# Patient Record
Sex: Female | Born: 1960 | Hispanic: Refuse to answer | Marital: Single
Health system: Southern US, Community
[De-identification: ages and names within clinical notes are randomized; demographics above are authoritative.]

---

## 2010-06-30 ENCOUNTER — Ambulatory Visit: Payer: Self-pay | Admitting: Internal Medicine

## 2010-07-08 ENCOUNTER — Inpatient Hospital Stay: Payer: Self-pay | Admitting: Internal Medicine

## 2010-07-30 ENCOUNTER — Ambulatory Visit: Payer: Self-pay | Admitting: Internal Medicine

## 2010-08-30 ENCOUNTER — Ambulatory Visit: Payer: Self-pay | Admitting: Internal Medicine

## 2010-12-21 ENCOUNTER — Ambulatory Visit: Payer: Self-pay | Admitting: Geriatric Medicine

## 2011-03-25 ENCOUNTER — Ambulatory Visit: Payer: Self-pay | Admitting: Internal Medicine

## 2011-05-28 ENCOUNTER — Emergency Department: Payer: Self-pay | Admitting: *Deleted

## 2011-12-02 LAB — CBC WITH DIFFERENTIAL/PLATELET
Basophil #: 0 10*3/uL (ref 0.0–0.1)
Basophil %: 0.3 %
Eosinophil #: 0.2 10*3/uL (ref 0.0–0.7)
Eosinophil %: 1.5 %
HCT: 48.7 % — ABNORMAL HIGH (ref 35.0–47.0)
HGB: 15.9 g/dL (ref 12.0–16.0)
Lymphocyte #: 4.5 10*3/uL — ABNORMAL HIGH (ref 1.0–3.6)
Lymphocyte %: 34.8 %
MCH: 28 pg (ref 26.0–34.0)
Monocyte #: 0.9 10*3/uL — ABNORMAL HIGH (ref 0.0–0.7)
Neutrophil #: 7.2 10*3/uL — ABNORMAL HIGH (ref 1.4–6.5)
Platelet: 216 10*3/uL (ref 150–440)
RBC: 5.68 10*6/uL — ABNORMAL HIGH (ref 3.80–5.20)
RDW: 13.8 % (ref 11.5–14.5)
WBC: 12.8 10*3/uL — ABNORMAL HIGH (ref 3.6–11.0)

## 2011-12-02 LAB — COMPREHENSIVE METABOLIC PANEL
Alkaline Phosphatase: 103 U/L (ref 50–136)
BUN: 11 mg/dL (ref 7–18)
Bilirubin,Total: 0.4 mg/dL (ref 0.2–1.0)
Calcium, Total: 8.7 mg/dL (ref 8.5–10.1)
Chloride: 106 mmol/L (ref 98–107)
Co2: 28 mmol/L (ref 21–32)
EGFR (African American): >60
Potassium: 3.9 mmol/L (ref 3.5–5.1)
SGOT(AST): 55 U/L — ABNORMAL HIGH (ref 15–37)
Sodium: 144 mmol/L (ref 136–145)
Total Protein: 6.8 g/dL (ref 6.4–8.2)

## 2011-12-03 ENCOUNTER — Inpatient Hospital Stay: Payer: Self-pay | Admitting: Internal Medicine

## 2011-12-03 LAB — URINALYSIS, COMPLETE
Bilirubin,UR: NEGATIVE
Blood: NEGATIVE
Leukocyte Esterase: NEGATIVE
Nitrite: NEGATIVE
Ph: 7 (ref 4.5–8.0)
Protein: NEGATIVE
RBC,UR: <1 /HPF (ref 0–5)
Squamous Epithelial: <1

## 2011-12-04 LAB — LIPID PANEL
Cholesterol: 141 mg/dL (ref 0–200)
Ldl Cholesterol, Calc: 82 mg/dL (ref 0–100)
Triglycerides: 97 mg/dL (ref 0–200)
VLDL Cholesterol, Calc: 19 mg/dL (ref 5–40)

## 2011-12-04 LAB — CBC WITH DIFFERENTIAL/PLATELET
Basophil %: 0.1 %
HCT: 45.7 % (ref 35.0–47.0)
HGB: 14.9 g/dL (ref 12.0–16.0)
Lymphocyte %: 8.8 %
MCH: 28.1 pg (ref 26.0–34.0)
MCHC: 32.7 g/dL (ref 32.0–36.0)
MCV: 86 fL (ref 80–100)
Monocyte #: 0.3 10*3/uL (ref 0.0–0.7)
Neutrophil #: 13.5 10*3/uL — ABNORMAL HIGH (ref 1.4–6.5)
Platelet: 224 10*3/uL (ref 150–440)
RBC: 5.32 10*6/uL — ABNORMAL HIGH (ref 3.80–5.20)
WBC: 15.1 10*3/uL — ABNORMAL HIGH (ref 3.6–11.0)

## 2011-12-05 ENCOUNTER — Ambulatory Visit: Payer: Self-pay | Admitting: Neurology

## 2011-12-06 LAB — WBC: WBC: 13 x10 3/mm 3 — ABNORMAL HIGH

## 2011-12-07 LAB — HEMOGLOBIN A1C

## 2011-12-08 LAB — HEMOGLOBIN A1C

## 2011-12-09 LAB — COMPREHENSIVE METABOLIC PANEL
Alkaline Phosphatase: 83 U/L (ref 50–136)
Anion Gap: 8 (ref 7–16)
BUN: 18 mg/dL (ref 7–18)
Bilirubin,Total: 0.3 mg/dL (ref 0.2–1.0)
Calcium, Total: 8.6 mg/dL (ref 8.5–10.1)
Co2: 28 mmol/L (ref 21–32)
Creatinine: 0.99 mg/dL (ref 0.60–1.30)
Glucose: 93 mg/dL (ref 65–99)
Osmolality: 287 (ref 275–301)
SGOT(AST): 49 U/L — ABNORMAL HIGH (ref 15–37)
SGPT (ALT): 46 U/L
Sodium: 143 mmol/L (ref 136–145)
Total Protein: 6.8 g/dL (ref 6.4–8.2)

## 2011-12-09 LAB — CBC
MCH: 27.4 pg (ref 26.0–34.0)
MCV: 86 fL (ref 80–100)
Platelet: 202 10*3/uL (ref 150–440)
RDW: 14.1 % (ref 11.5–14.5)
WBC: 12.5 10*3/uL — ABNORMAL HIGH (ref 3.6–11.0)

## 2011-12-10 ENCOUNTER — Inpatient Hospital Stay: Payer: Self-pay | Admitting: Internal Medicine

## 2011-12-10 LAB — TROPONIN I
Troponin-I: <0.02 ng/mL
Troponin-I: <0.02 ng/mL

## 2011-12-10 LAB — CK TOTAL AND CKMB (NOT AT ARMC)
CK, Total: 297 U/L — ABNORMAL HIGH (ref 21–215)
CK, Total: 139 U/L (ref 21–215)
CK-MB: 2 ng/mL (ref 0.5–3.6)
CK-MB: 1.1 ng/mL (ref 0.5–3.6)
CK-MB: 0.9 ng/mL (ref 0.5–3.6)

## 2011-12-11 LAB — CBC WITH DIFFERENTIAL/PLATELET
Basophil %: 0.1 %
Eosinophil #: 0 10*3/uL (ref 0.0–0.7)
HCT: 44 % (ref 35.0–47.0)
HGB: 14 g/dL (ref 12.0–16.0)
MCH: 27.6 pg (ref 26.0–34.0)
MCHC: 31.7 g/dL — ABNORMAL LOW (ref 32.0–36.0)
Monocyte #: 0.3 x10 3/mm (ref 0.2–0.9)
Monocyte %: 3.3 %
Neutrophil #: 7.4 10*3/uL — ABNORMAL HIGH (ref 1.4–6.5)
Platelet: 186 10*3/uL (ref 150–440)
RBC: 5.06 10*6/uL (ref 3.80–5.20)
WBC: 8.3 10*3/uL (ref 3.6–11.0)

## 2011-12-11 LAB — BASIC METABOLIC PANEL
Anion Gap: 8 (ref 7–16)
BUN: 19 mg/dL — ABNORMAL HIGH (ref 7–18)
Calcium, Total: 8.9 mg/dL (ref 8.5–10.1)
EGFR (African American): >60
EGFR (Non-African Amer.): >60
Glucose: 229 mg/dL — ABNORMAL HIGH (ref 65–99)
Osmolality: 289 (ref 275–301)
Potassium: 4.6 mmol/L (ref 3.5–5.1)
Sodium: 140 mmol/L (ref 136–145)

## 2011-12-11 LAB — MAGNESIUM: Magnesium: 2.2 mg/dL

## 2011-12-12 LAB — VANCOMYCIN, TROUGH: Vancomycin, Trough: 14 ug/mL (ref 10–20)

## 2011-12-13 LAB — CULTURE, BLOOD (SINGLE)

## 2011-12-14 LAB — VANCOMYCIN, TROUGH: Vancomycin, Trough: 14 ug/mL (ref 10–20)

## 2011-12-15 LAB — BASIC METABOLIC PANEL
Anion Gap: 12 (ref 7–16)
Calcium, Total: 8.6 mg/dL (ref 8.5–10.1)
Co2: 28 mmol/L (ref 21–32)
Creatinine: 0.85 mg/dL (ref 0.60–1.30)
EGFR (Non-African Amer.): >60
Glucose: 329 mg/dL — ABNORMAL HIGH (ref 65–99)

## 2012-01-25 ENCOUNTER — Inpatient Hospital Stay: Payer: Self-pay | Admitting: Internal Medicine

## 2012-01-25 LAB — CBC WITH DIFFERENTIAL/PLATELET
Basophil %: 1 %
HGB: 14.3 g/dL (ref 12.0–16.0)
Lymphocyte #: 3.2 10*3/uL (ref 1.0–3.6)
Lymphocyte %: 29.1 %
MCHC: 31.9 g/dL — ABNORMAL LOW (ref 32.0–36.0)
MCV: 88 fL (ref 80–100)
Monocyte #: 0.6 x10 3/mm (ref 0.2–0.9)
Platelet: 149 10*3/uL — ABNORMAL LOW (ref 150–440)
RBC: 5.08 10*6/uL (ref 3.80–5.20)
RDW: 14.5 % (ref 11.5–14.5)

## 2012-01-25 LAB — URINALYSIS, COMPLETE
Bilirubin,UR: NEGATIVE
Glucose,UR: NEGATIVE mg/dL (ref 0–75)
Ketone: NEGATIVE
Nitrite: NEGATIVE
Ph: 8 (ref 4.5–8.0)
Protein: NEGATIVE
RBC,UR: 13 /HPF (ref 0–5)
Specific Gravity: 1.003 (ref 1.003–1.030)
Squamous Epithelial: <1
WBC UR: 122 /HPF (ref 0–5)

## 2012-01-25 LAB — TROPONIN I
Troponin-I: <0.02 ng/mL
Troponin-I: <0.02 ng/mL

## 2012-01-25 LAB — TSH: Thyroid Stimulating Horm: 4.17 u[IU]/mL

## 2012-01-25 LAB — COMPREHENSIVE METABOLIC PANEL
BUN: 14 mg/dL (ref 7–18)
Co2: 28 mmol/L (ref 21–32)
Creatinine: 0.83 mg/dL (ref 0.60–1.30)
EGFR (African American): >60
EGFR (Non-African Amer.): >60
Glucose: 84 mg/dL (ref 65–99)
Potassium: 4.2 mmol/L (ref 3.5–5.1)
SGOT(AST): 47 U/L — ABNORMAL HIGH (ref 15–37)
Sodium: 143 mmol/L (ref 136–145)

## 2012-01-26 LAB — CK TOTAL AND CKMB (NOT AT ARMC)
CK, Total: 31 U/L (ref 21–215)
CK-MB: 0.8 ng/mL (ref 0.5–3.6)

## 2012-01-26 LAB — TROPONIN I: Troponin-I: <0.02 ng/mL

## 2012-01-26 LAB — BASIC METABOLIC PANEL
Anion Gap: 11 (ref 7–16)
Chloride: 110 mmol/L — ABNORMAL HIGH (ref 98–107)
Co2: 23 mmol/L (ref 21–32)
Creatinine: 0.77 mg/dL (ref 0.60–1.30)
EGFR (Non-African Amer.): >60
Glucose: 238 mg/dL — ABNORMAL HIGH (ref 65–99)
Osmolality: 294 (ref 275–301)
Sodium: 144 mmol/L (ref 136–145)

## 2012-01-26 LAB — CBC WITH DIFFERENTIAL/PLATELET
Basophil #: 0 10*3/uL (ref 0.0–0.1)
Basophil %: 0.3 %
Eosinophil #: 0 10*3/uL (ref 0.0–0.7)
Eosinophil %: 0 %
HGB: 12.8 g/dL (ref 12.0–16.0)
Lymphocyte %: 15.8 %
MCH: 28.3 pg (ref 26.0–34.0)
MCHC: 32.6 g/dL (ref 32.0–36.0)
MCV: 87 fL (ref 80–100)
Monocyte #: 0.1 x10 3/mm — ABNORMAL LOW (ref 0.2–0.9)
Monocyte %: 0.9 %
Neutrophil #: 5.2 10*3/uL (ref 1.4–6.5)
Neutrophil %: 83 %
RBC: 4.54 10*6/uL (ref 3.80–5.20)
RDW: 14.5 % (ref 11.5–14.5)
WBC: 6.2 10*3/uL (ref 3.6–11.0)

## 2012-01-26 LAB — HEMOGLOBIN A1C: Hemoglobin A1C: 7.2 % — ABNORMAL HIGH (ref 4.2–6.3)

## 2012-01-26 LAB — MAGNESIUM: Magnesium: 1.7 mg/dL — ABNORMAL LOW

## 2012-01-26 LAB — LIPID PANEL
Cholesterol: 108 mg/dL (ref 0–200)
HDL Cholesterol: 48 mg/dL (ref 40–60)
VLDL Cholesterol, Calc: 10 mg/dL (ref 5–40)

## 2012-01-27 LAB — URINE CULTURE

## 2012-01-28 LAB — CBC WITH DIFFERENTIAL/PLATELET
Basophil #: 0 10*3/uL (ref 0.0–0.1)
Basophil %: 0.1 %
Eosinophil #: 0 10*3/uL (ref 0.0–0.7)
Eosinophil %: 0 %
HCT: 36.7 % (ref 35.0–47.0)
Lymphocyte #: 1.1 10*3/uL (ref 1.0–3.6)
Lymphocyte %: 11 %
MCHC: 33 g/dL (ref 32.0–36.0)
MCV: 86 fL (ref 80–100)
Monocyte #: 0.3 x10 3/mm (ref 0.2–0.9)
Neutrophil #: 8.5 10*3/uL — ABNORMAL HIGH (ref 1.4–6.5)
Neutrophil %: 85.9 %
Platelet: 163 10*3/uL (ref 150–440)
RBC: 4.29 10*6/uL (ref 3.80–5.20)

## 2012-01-31 LAB — PLATELET COUNT: Platelet: 162 10*3/uL (ref 150–440)

## 2012-01-31 LAB — CULTURE, BLOOD (SINGLE)

## 2012-02-18 ENCOUNTER — Emergency Department: Payer: Self-pay | Admitting: Emergency Medicine

## 2012-02-19 LAB — URINALYSIS, COMPLETE
Bilirubin,UR: NEGATIVE
Glucose,UR: NEGATIVE mg/dL (ref 0–75)
Hyaline Cast: 4
Ketone: NEGATIVE
Nitrite: NEGATIVE
Ph: 6 (ref 4.5–8.0)
Protein: NEGATIVE
Specific Gravity: 1.012 (ref 1.003–1.030)
Squamous Epithelial: 65

## 2012-02-19 LAB — AMMONIA: Ammonia, Plasma: <25 mcmol/L (ref 11–32)

## 2012-02-19 LAB — CBC
HCT: 42.2 % (ref 35.0–47.0)
RBC: 4.73 10*6/uL (ref 3.80–5.20)
WBC: 8.7 10*3/uL (ref 3.6–11.0)

## 2012-02-19 LAB — TSH: Thyroid Stimulating Horm: 6.71 u[IU]/mL — ABNORMAL HIGH

## 2012-02-19 LAB — COMPREHENSIVE METABOLIC PANEL
Albumin: 3 g/dL — ABNORMAL LOW (ref 3.4–5.0)
Alkaline Phosphatase: 104 U/L (ref 50–136)
Anion Gap: 6 — ABNORMAL LOW (ref 7–16)
BUN: 9 mg/dL (ref 7–18)
Calcium, Total: 8.9 mg/dL (ref 8.5–10.1)
Chloride: 105 mmol/L (ref 98–107)
Co2: 29 mmol/L (ref 21–32)
EGFR (African American): >60
SGPT (ALT): 33 U/L
Total Protein: 7.1 g/dL (ref 6.4–8.2)

## 2012-02-19 LAB — SALICYLATE LEVEL: Salicylates, Serum: <1.7 mg/dL

## 2012-02-19 LAB — ACETAMINOPHEN LEVEL: Acetaminophen: <2 ug/mL

## 2012-02-22 LAB — URINE CULTURE

## 2012-04-21 LAB — URINALYSIS, COMPLETE
Bacteria: NONE SEEN
Bilirubin,UR: NEGATIVE
Glucose,UR: NEGATIVE mg/dL (ref 0–75)
Ketone: NEGATIVE
Leukocyte Esterase: NEGATIVE
Ph: 5 (ref 4.5–8.0)
RBC,UR: <1 /HPF (ref 0–5)
Squamous Epithelial: 1
WBC UR: 2 /HPF (ref 0–5)

## 2012-04-21 LAB — CK TOTAL AND CKMB (NOT AT ARMC)
CK, Total: 54 U/L (ref 21–215)
CK-MB: <0.5 ng/mL — ABNORMAL LOW (ref 0.5–3.6)

## 2012-04-21 LAB — COMPREHENSIVE METABOLIC PANEL
Alkaline Phosphatase: 113 U/L (ref 50–136)
Anion Gap: 9 (ref 7–16)
Bilirubin,Total: 0.2 mg/dL (ref 0.2–1.0)
Calcium, Total: 8.9 mg/dL (ref 8.5–10.1)
Co2: 28 mmol/L (ref 21–32)
Osmolality: 285 (ref 275–301)
SGOT(AST): 66 U/L — ABNORMAL HIGH (ref 15–37)
SGPT (ALT): 41 U/L (ref 12–78)
Total Protein: 7.3 g/dL (ref 6.4–8.2)

## 2012-04-21 LAB — CBC WITH DIFFERENTIAL/PLATELET
Basophil %: 0.8 %
Eosinophil #: 0.5 10*3/uL (ref 0.0–0.7)
Eosinophil %: 4.8 %
HCT: 45.4 % (ref 35.0–47.0)
Lymphocyte #: 4.7 10*3/uL — ABNORMAL HIGH (ref 1.0–3.6)
MCH: 27.6 pg (ref 26.0–34.0)
MCHC: 32.5 g/dL (ref 32.0–36.0)
Monocyte #: 0.5 x10 3/mm (ref 0.2–0.9)
Neutrophil #: 5.3 10*3/uL (ref 1.4–6.5)
Neutrophil %: 47.5 %
Platelet: 219 10*3/uL (ref 150–440)
RBC: 5.36 10*6/uL — ABNORMAL HIGH (ref 3.80–5.20)
RDW: 14.2 % (ref 11.5–14.5)

## 2012-04-21 LAB — DRUG SCREEN, URINE
Amphetamines, Ur Screen: NEGATIVE (ref ?–1000)
Benzodiazepine, Ur Scrn: POSITIVE (ref ?–200)
Cannabinoid 50 Ng, Ur ~~LOC~~: NEGATIVE (ref ?–50)
MDMA (Ecstasy)Ur Screen: NEGATIVE (ref ?–500)
Methadone, Ur Screen: NEGATIVE (ref ?–300)
Phencyclidine (PCP) Ur S: NEGATIVE (ref ?–25)

## 2012-04-22 ENCOUNTER — Inpatient Hospital Stay: Payer: Self-pay | Admitting: Internal Medicine

## 2012-04-22 LAB — CK TOTAL AND CKMB (NOT AT ARMC)
CK, Total: 36 U/L (ref 21–215)
CK, Total: 31 U/L (ref 21–215)
CK-MB: <0.5 ng/mL — ABNORMAL LOW (ref 0.5–3.6)
CK-MB: <0.5 ng/mL — ABNORMAL LOW (ref 0.5–3.6)

## 2012-04-22 LAB — MAGNESIUM: Magnesium: 2.1 mg/dL

## 2012-04-22 LAB — TROPONIN I: Troponin-I: <0.02 ng/mL

## 2012-04-22 LAB — PROTIME-INR
INR: 0.9
Prothrombin Time: 12.8 secs (ref 11.5–14.7)

## 2012-04-23 LAB — COMPREHENSIVE METABOLIC PANEL
Albumin: 2.9 g/dL — ABNORMAL LOW (ref 3.4–5.0)
Alkaline Phosphatase: 97 U/L (ref 50–136)
Anion Gap: 13 (ref 7–16)
BUN: 16 mg/dL (ref 7–18)
Calcium, Total: 8.4 mg/dL — ABNORMAL LOW (ref 8.5–10.1)
EGFR (Non-African Amer.): >60
Glucose: 244 mg/dL — ABNORMAL HIGH (ref 65–99)
Potassium: 4.1 mmol/L (ref 3.5–5.1)
SGOT(AST): 37 U/L (ref 15–37)
SGPT (ALT): 33 U/L (ref 12–78)
Total Protein: 6.2 g/dL — ABNORMAL LOW (ref 6.4–8.2)

## 2012-04-23 LAB — CBC WITH DIFFERENTIAL/PLATELET
Basophil %: 0.2 %
Eosinophil %: 0 %
HCT: 39.8 % (ref 35.0–47.0)
Lymphocyte #: 1.3 10*3/uL (ref 1.0–3.6)
MCH: 27.8 pg (ref 26.0–34.0)
MCHC: 32.8 g/dL (ref 32.0–36.0)
MCV: 85 fL (ref 80–100)
Monocyte #: 0.2 x10 3/mm (ref 0.2–0.9)
Neutrophil #: 9.4 10*3/uL — ABNORMAL HIGH (ref 1.4–6.5)
Neutrophil %: 86.6 %
WBC: 10.8 10*3/uL (ref 3.6–11.0)

## 2012-06-09 ENCOUNTER — Emergency Department: Payer: Self-pay | Admitting: Internal Medicine

## 2012-06-09 LAB — COMPREHENSIVE METABOLIC PANEL
Anion Gap: 8 (ref 7–16)
BUN: 14 mg/dL (ref 7–18)
Calcium, Total: 9.4 mg/dL (ref 8.5–10.1)
Chloride: 109 mmol/L — ABNORMAL HIGH (ref 98–107)
Co2: 27 mmol/L (ref 21–32)
EGFR (African American): >60
Osmolality: 287 (ref 275–301)
Potassium: 4 mmol/L (ref 3.5–5.1)
SGOT(AST): 48 U/L — ABNORMAL HIGH (ref 15–37)
SGPT (ALT): 38 U/L (ref 12–78)
Sodium: 144 mmol/L (ref 136–145)
Total Protein: 8.3 g/dL — ABNORMAL HIGH (ref 6.4–8.2)

## 2012-06-09 LAB — CBC
HGB: 16.1 g/dL — ABNORMAL HIGH (ref 12.0–16.0)
MCH: 27.4 pg (ref 26.0–34.0)
MCHC: 32.6 g/dL (ref 32.0–36.0)
Platelet: 224 10*3/uL (ref 150–440)

## 2012-06-09 LAB — PROTIME-INR: INR: 0.9

## 2012-06-09 LAB — ETHANOL: Ethanol: <3 mg/dL

## 2012-06-09 LAB — URINALYSIS, COMPLETE
Bacteria: NONE SEEN
Blood: NEGATIVE
Ketone: NEGATIVE
Nitrite: NEGATIVE
Ph: 5 (ref 4.5–8.0)
Protein: NEGATIVE
RBC,UR: 1 /HPF (ref 0–5)
Specific Gravity: 1.021 (ref 1.003–1.030)
WBC UR: 3 /HPF (ref 0–5)

## 2012-06-09 LAB — AMMONIA: Ammonia, Plasma: 28 mcmol/L (ref 11–32)

## 2012-06-09 LAB — ACETAMINOPHEN LEVEL: Acetaminophen: <2 ug/mL

## 2012-06-09 LAB — TSH: Thyroid Stimulating Horm: 1.9 u[IU]/mL

## 2012-07-18 IMAGING — CR DG CHEST 1V PORT
1 series · 1 of 1 positions shown · non-contrast
Comparison: none

REASON FOR EXAM: intubated
COMMENTS:

[view not recorded]
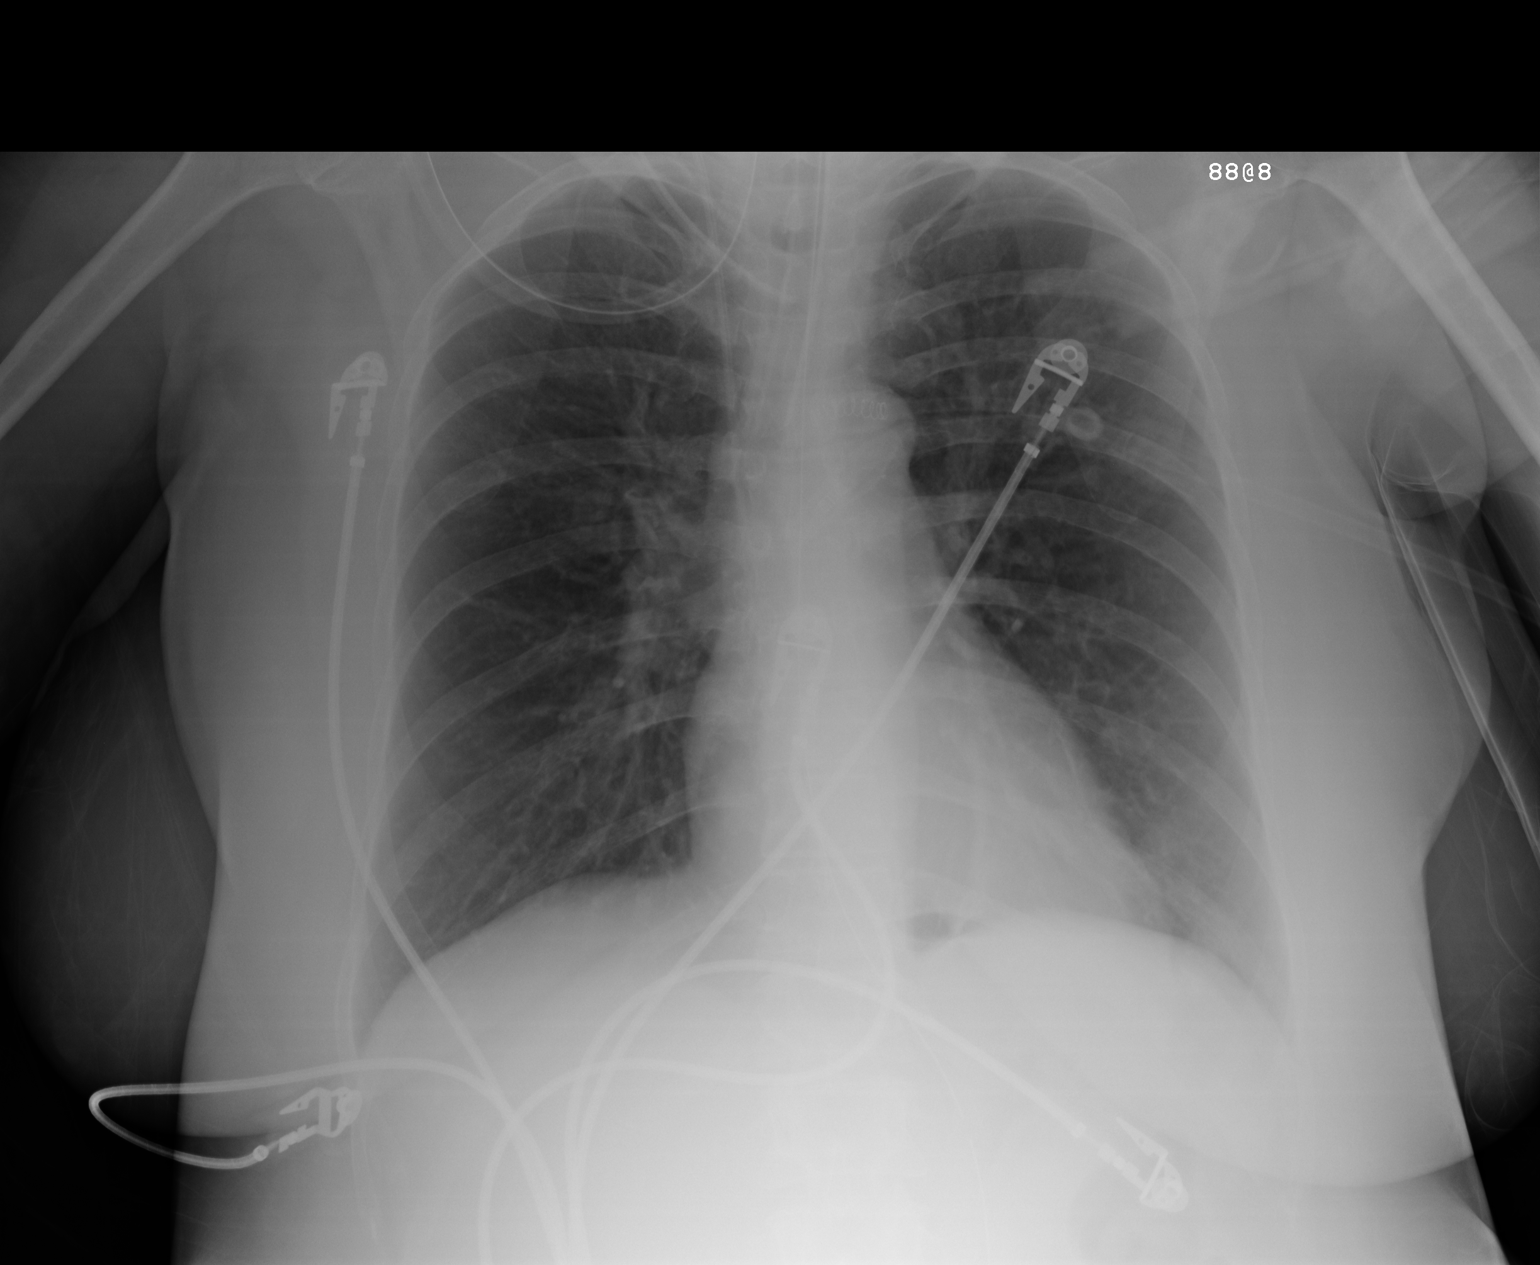

[1 of 1 positions shown; findings below may reference images not displayed]

PROCEDURE:     DXR - DXR PORTABLE CHEST SINGLE VIEW  - July 12, 2010  [DATE]

RESULT:     Comparison is made to the study of 07/11/2010 at [DATE] a.m.
Endotracheal tube is present the level of the aortic arch approximately 2 cm
above the carina. An esophagogastric tube is present and passes below the
diaphragm. There is a right internal jugular central venous catheter present
with the tip in the superior vena cava. There is no pneumothorax. The
cardiac silhouette is normal. Cardiac monitoring electrodes are present. The
lungs appear clear.
IMPRESSION: No acute cardiopulmonary disease evident. Lines and tubes
as described.

## 2012-07-24 IMAGING — US ABDOMEN ULTRASOUND
1 series · 17 of 25 positions shown · non-contrast
Comparison: none

REASON FOR EXAM: elevated lipase/ metabolic acidosis
COMMENTS:

[Series 1: abdomen ultrasound · 17 of 56 slices shown]
[im 1/56]
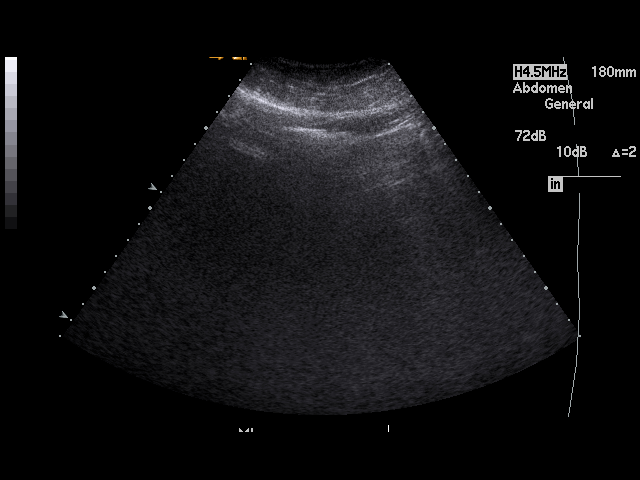
[im 5/56]
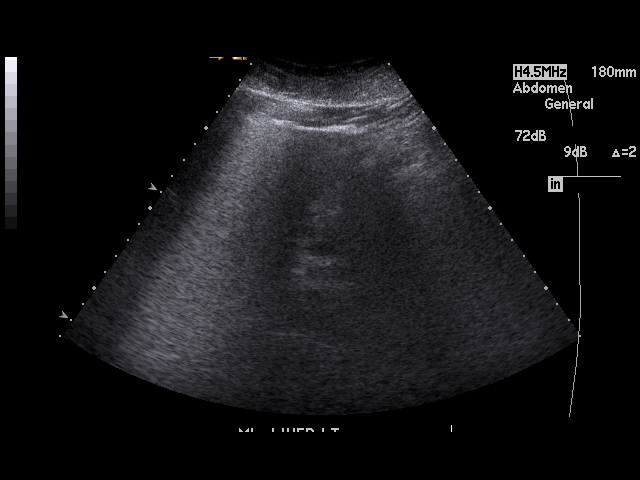
[im 7/56]
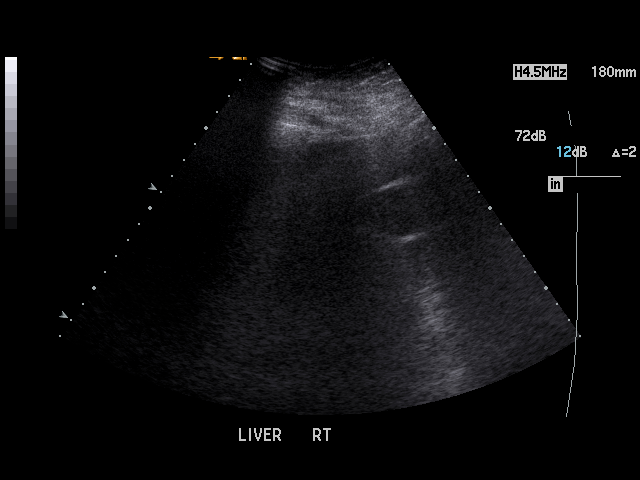
[im 12/56]
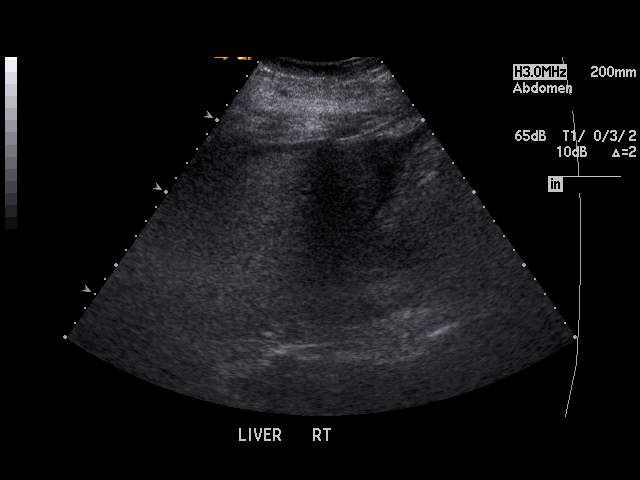
[im 14/56]
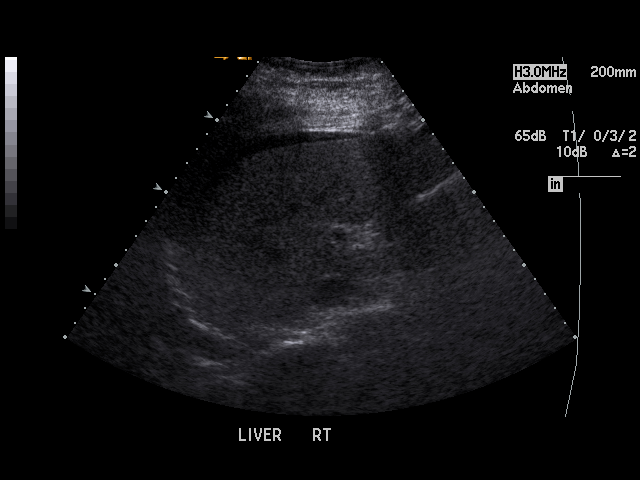
[im 19/56]
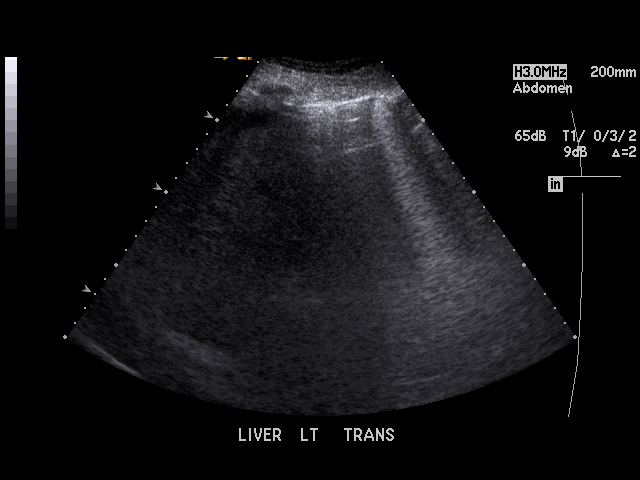
[im 21/56]
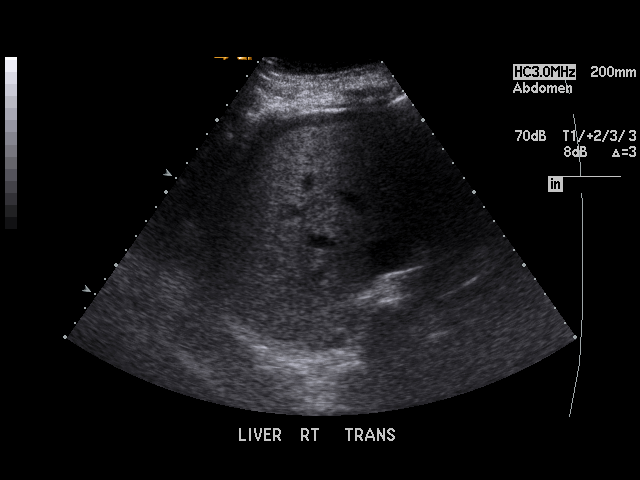
[im 26/56]
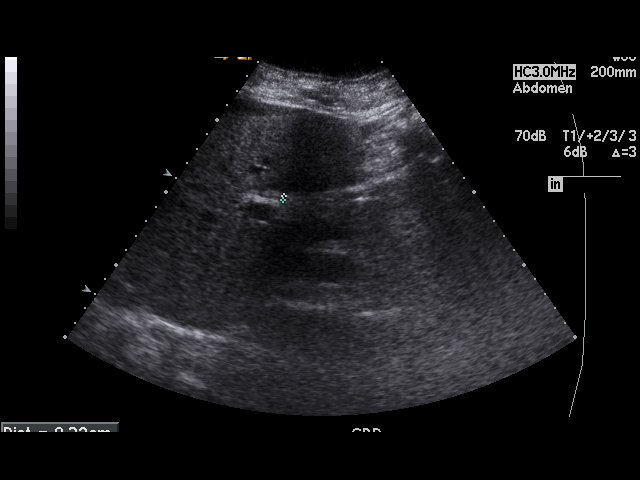
[im 28/56]
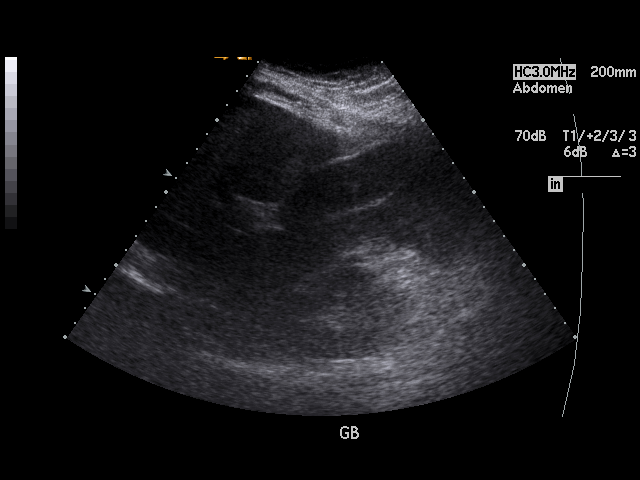
[im 30/56]
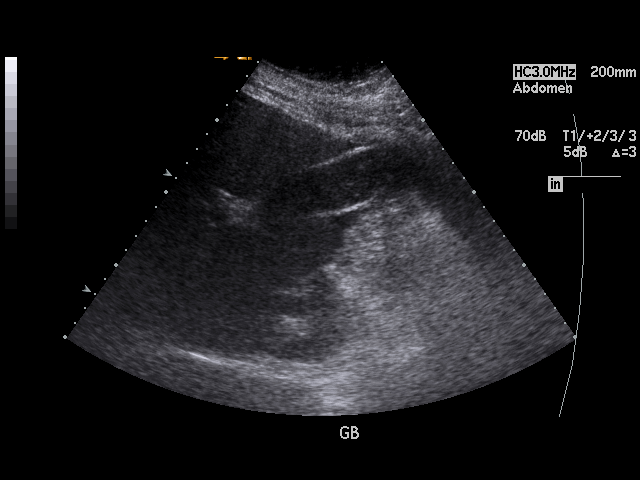
[im 35/56]
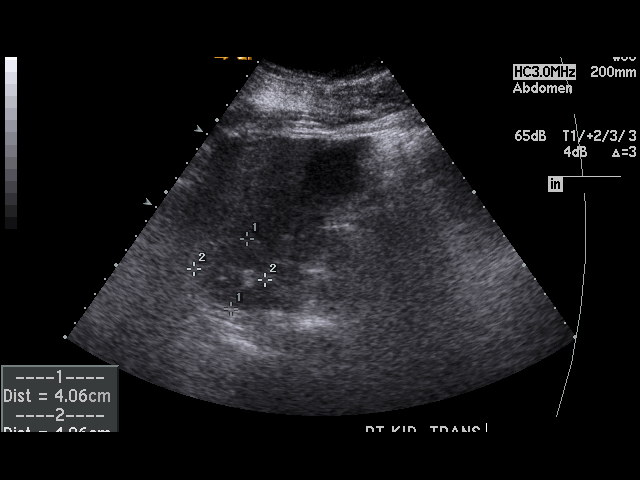
[im 37/56]
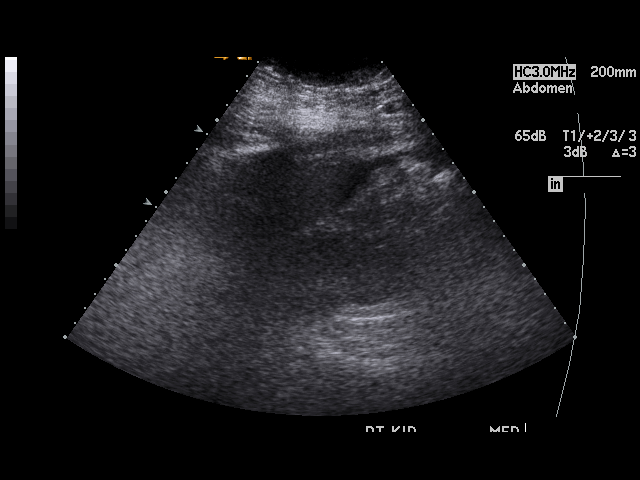
[im 42/56]
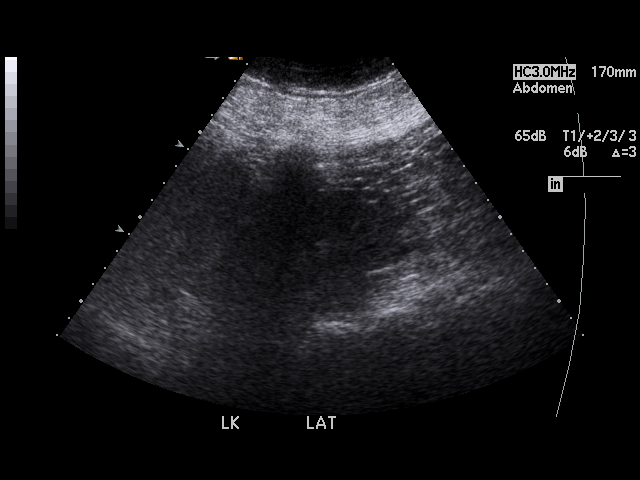
[im 44/56]
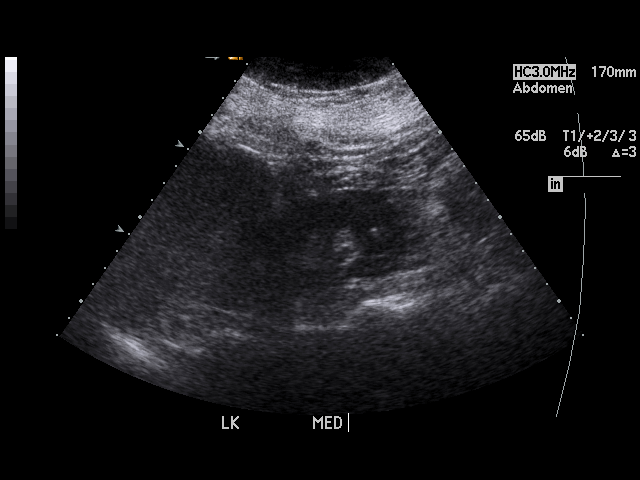
[im 49/56]
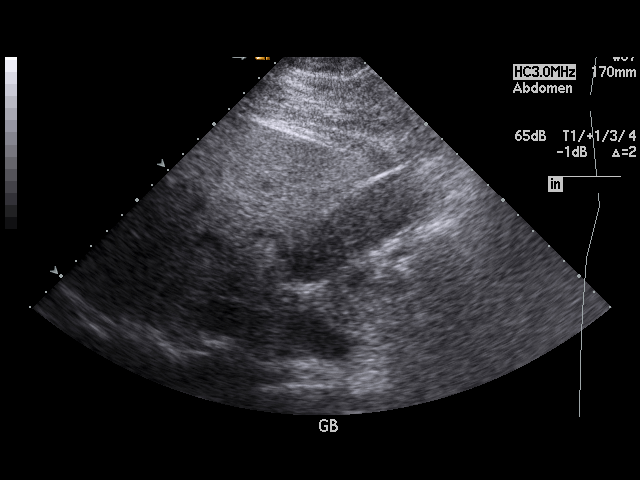
[im 51/56]
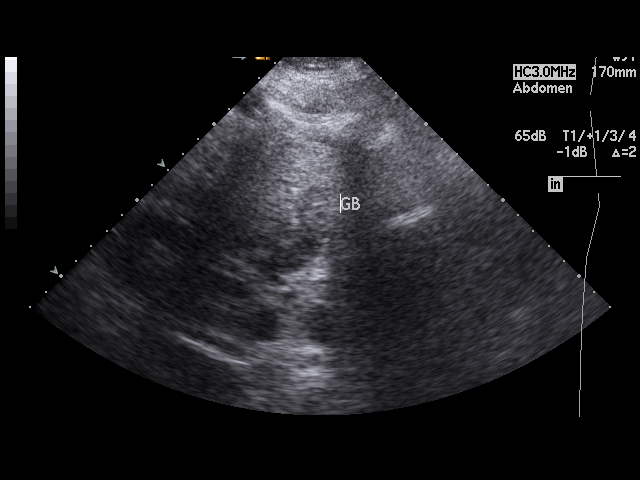
[im 56/56]
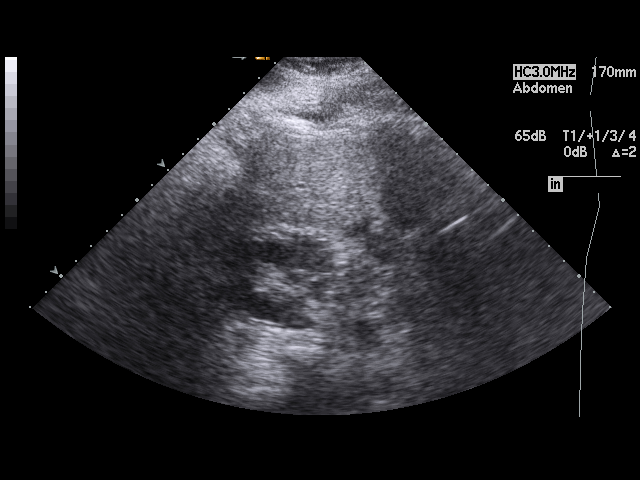

[17 of 25 positions shown; findings below may reference images not displayed]

PROCEDURE:     US  - US ABDOMEN GENERAL SURVEY  - July 18, 2010  [DATE]

RESULT:     Comparison: None.

Technique and Findings:
Multiple grayscale and color Doppler images obtained of the abdomen.

The examination is limited secondary to patient condition and body habitus.
 Evaluation of the liver is limited as the liver is diffusely echogenic and
dense, most consistent with hepatic steatosis. The portal vein is patent.
The pancreas is not visualized secondary to overlying bowel gas. The spleen
is within normal limits. There is a small amount of ascites.  Sludge is
identified within the gallbladder. The sonographic Murphy sign was not able
to be accurately assessed due to patient condition. The gallbladder wall
thickness is at the upper limits normal, measuring 3 mm. This may be related
to the ascites The common bile duct measures 2 mm in diameter.

Images of the kidneys show no hydronephrosis.
IMPRESSION: Limited examination.
1. Pancreas not visualized secondary to overlying bowel gas.
2. Sludge within the gallbladder. Gallbladder wall thickness upper limits
normal, which may be related to the ascites.
3. Hepatic steatosis.

## 2012-09-22 ENCOUNTER — Inpatient Hospital Stay: Payer: Self-pay | Admitting: Internal Medicine

## 2012-09-22 LAB — CK TOTAL AND CKMB (NOT AT ARMC)
CK, Total: 68 U/L (ref 21–215)
CK-MB: <0.5 ng/mL — ABNORMAL LOW (ref 0.5–3.6)

## 2012-09-22 LAB — COMPREHENSIVE METABOLIC PANEL
Albumin: 3.4 g/dL (ref 3.4–5.0)
Alkaline Phosphatase: 126 U/L (ref 50–136)
Anion Gap: 8 (ref 7–16)
Calcium, Total: 8.9 mg/dL (ref 8.5–10.1)
Creatinine: 0.74 mg/dL (ref 0.60–1.30)
EGFR (Non-African Amer.): >60
Glucose: 103 mg/dL — ABNORMAL HIGH (ref 65–99)
Osmolality: 283 (ref 275–301)
Potassium: 4.2 mmol/L (ref 3.5–5.1)
SGOT(AST): 36 U/L (ref 15–37)
SGPT (ALT): 35 U/L (ref 12–78)

## 2012-09-22 LAB — CBC
HCT: 43.5 % (ref 35.0–47.0)
HGB: 13.9 g/dL (ref 12.0–16.0)
Platelet: 209 10*3/uL (ref 150–440)
RBC: 5.01 10*6/uL (ref 3.80–5.20)
WBC: 14.8 10*3/uL — ABNORMAL HIGH (ref 3.6–11.0)

## 2012-09-22 LAB — PRO B NATRIURETIC PEPTIDE: B-Type Natriuretic Peptide: 354 pg/mL — ABNORMAL HIGH (ref 0–125)

## 2012-09-23 ENCOUNTER — Ambulatory Visit: Payer: Self-pay | Admitting: Neurology

## 2012-09-28 LAB — CULTURE, BLOOD (SINGLE)

## 2012-11-11 ENCOUNTER — Emergency Department: Payer: Self-pay | Admitting: Emergency Medicine

## 2012-11-11 LAB — AMMONIA: Ammonia, Plasma: 32 mcmol/L (ref 11–32)

## 2012-11-11 LAB — COMPREHENSIVE METABOLIC PANEL
Albumin: 3.2 g/dL — ABNORMAL LOW (ref 3.4–5.0)
Anion Gap: 2 — ABNORMAL LOW (ref 7–16)
Bilirubin,Total: 0.3 mg/dL (ref 0.2–1.0)
Chloride: 107 mmol/L (ref 98–107)
Co2: 31 mmol/L (ref 21–32)
Creatinine: 0.88 mg/dL (ref 0.60–1.30)
Osmolality: 281 (ref 275–301)
Potassium: 4.2 mmol/L (ref 3.5–5.1)
SGOT(AST): 39 U/L — ABNORMAL HIGH (ref 15–37)

## 2012-11-11 LAB — URINALYSIS, COMPLETE
Bilirubin,UR: NEGATIVE
Blood: NEGATIVE
Glucose,UR: NEGATIVE mg/dL (ref 0–75)
Leukocyte Esterase: NEGATIVE
Nitrite: NEGATIVE
Ph: 5 (ref 4.5–8.0)
Protein: NEGATIVE
RBC,UR: 1 /HPF (ref 0–5)
Specific Gravity: 1.018 (ref 1.003–1.030)

## 2012-11-11 LAB — TROPONIN I: Troponin-I: <0.02 ng/mL

## 2012-11-11 LAB — CBC
MCH: 28.2 pg (ref 26.0–34.0)
MCHC: 32.8 g/dL (ref 32.0–36.0)
MCV: 86 fL (ref 80–100)
RBC: 4.97 10*6/uL (ref 3.80–5.20)
WBC: 8.9 10*3/uL (ref 3.6–11.0)

## 2013-01-28 DEATH — deceased

## 2013-03-31 IMAGING — CT CT CHEST W/ CM
1 series · 15 of 31 positions shown, 19 images · non-contrast
Comparison: none

REASON FOR EXAM: IV dye allergy office to medicate   respiratory  failure
 RDS
COMMENTS:

[Series 2: soft tissue · axial · 0.63mm/px · z∈[-578,-328]mm · 15 of 56 slices shown, 19 images]
[im 3/56  mediastinal]
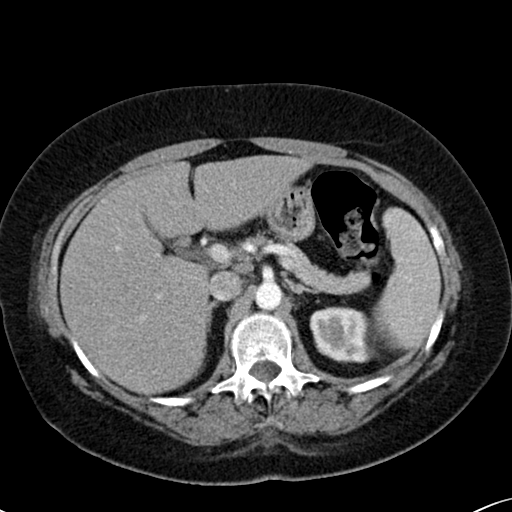
[im 3/56  lung]
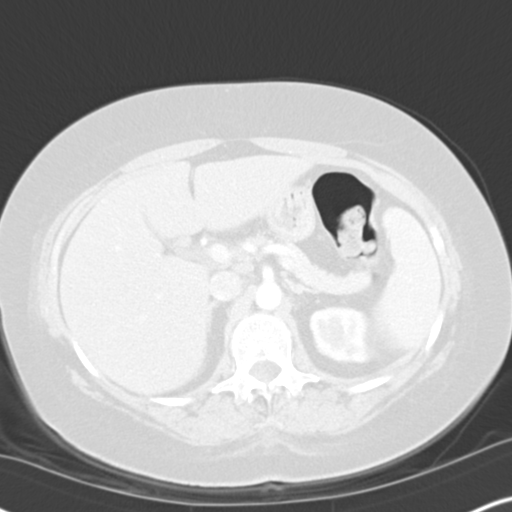
[im 7/56  lung]
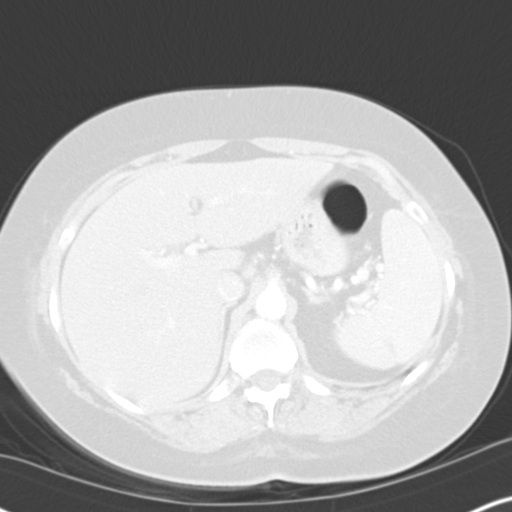
[im 11/56  lung]
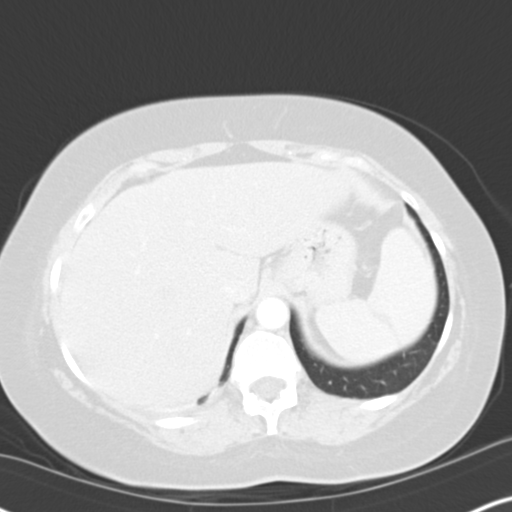
[im 13/56  lung]
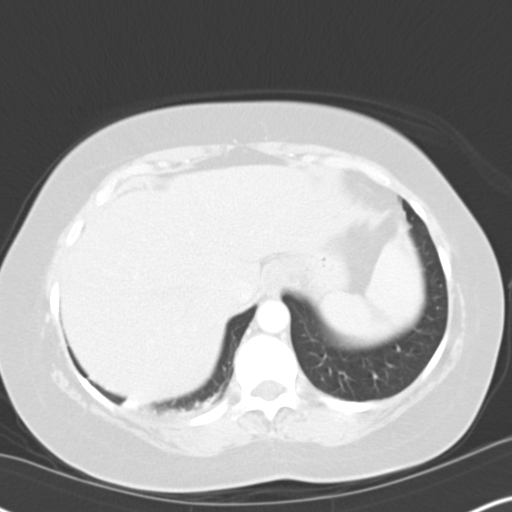
[im 17/56  mediastinal]
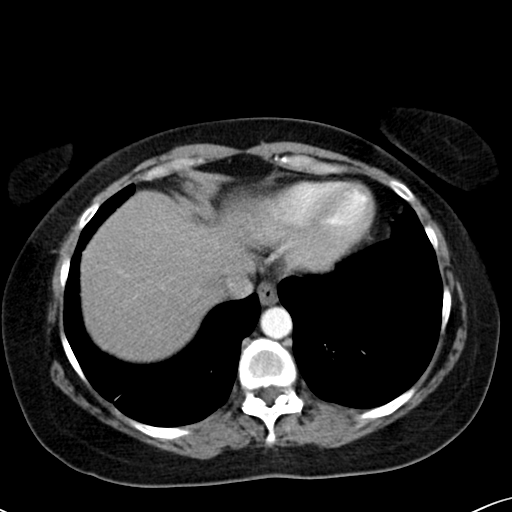
[im 17/56  lung]
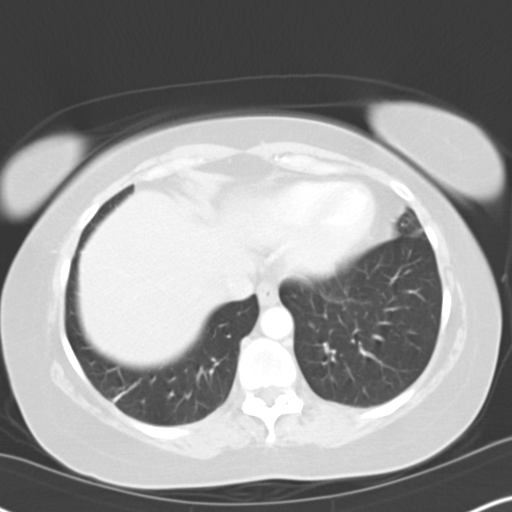
[im 21/56  lung]
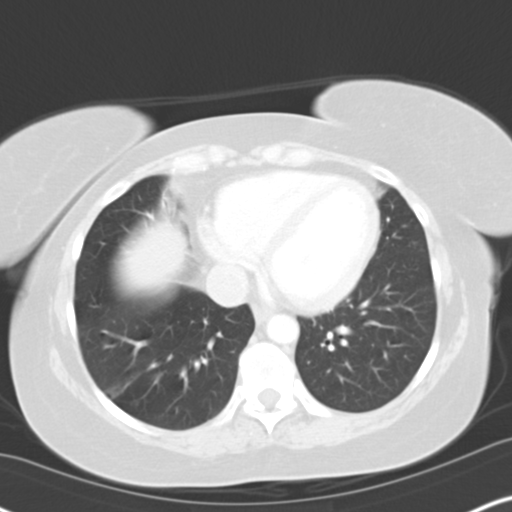
[im 25/56  lung]
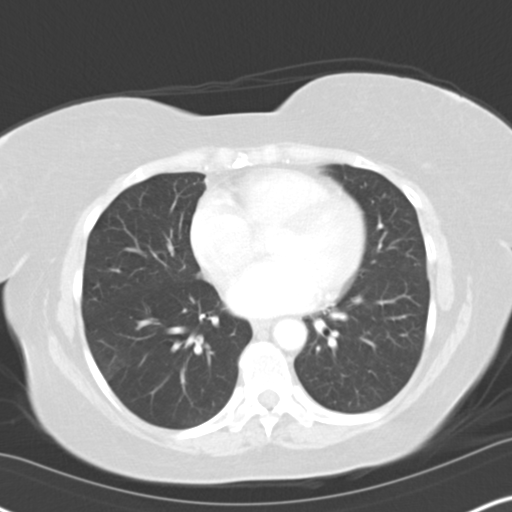
[im 29/56  lung]
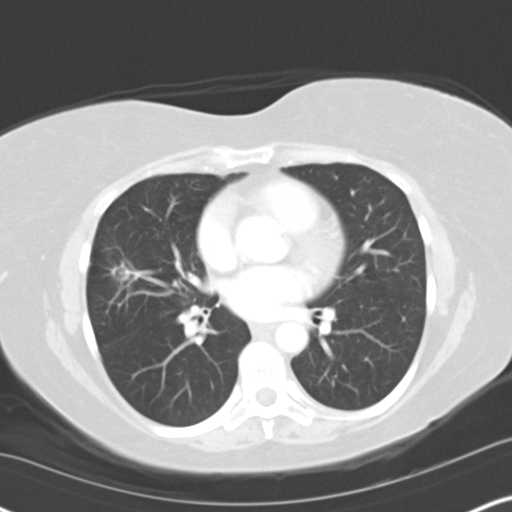
[im 31/56  mediastinal]
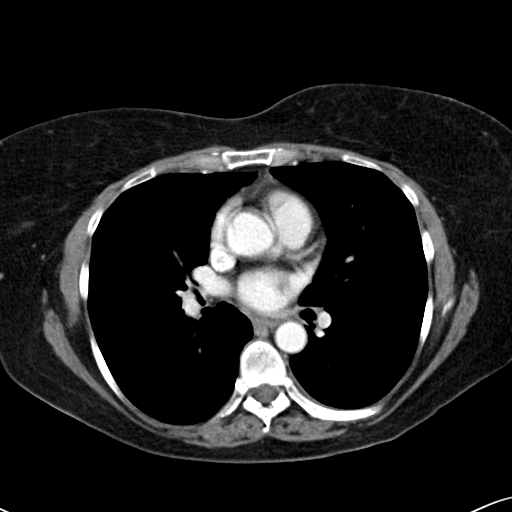
[im 31/56  lung]
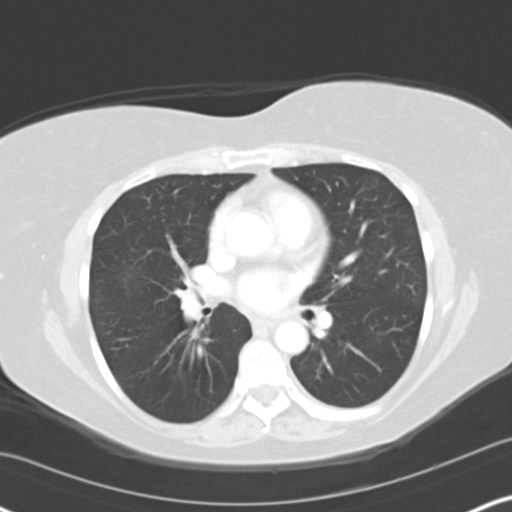
[im 34/56  lung]
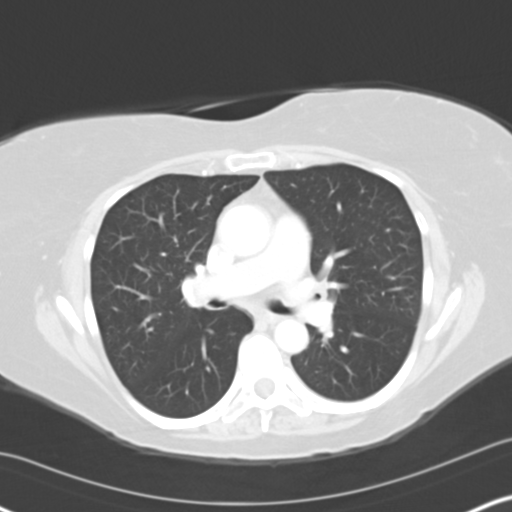
[im 37/56  lung]
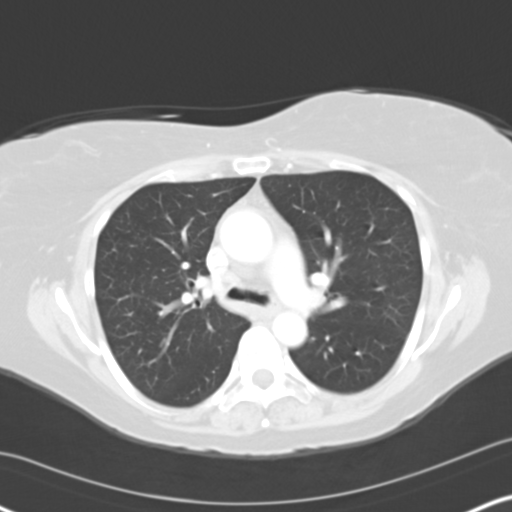
[im 41/56  lung]
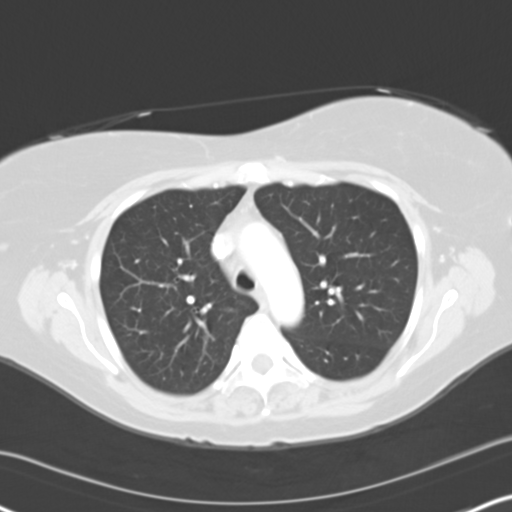
[im 45/56  mediastinal]
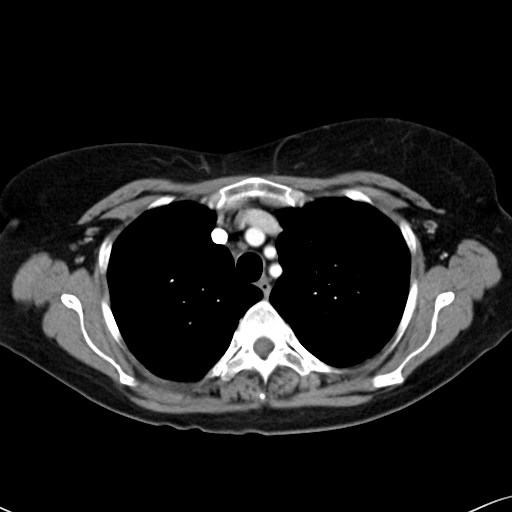
[im 45/56  lung]
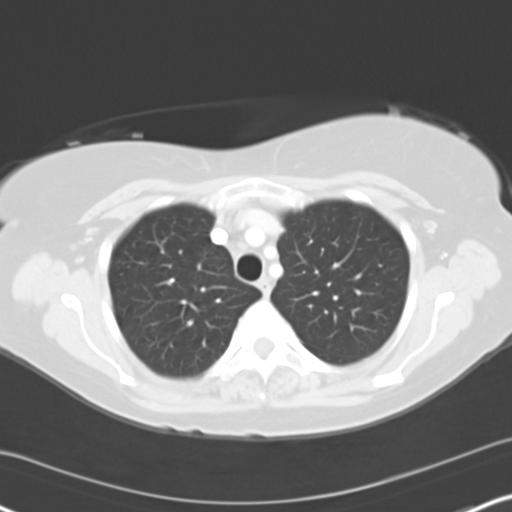
[im 49/56  lung]
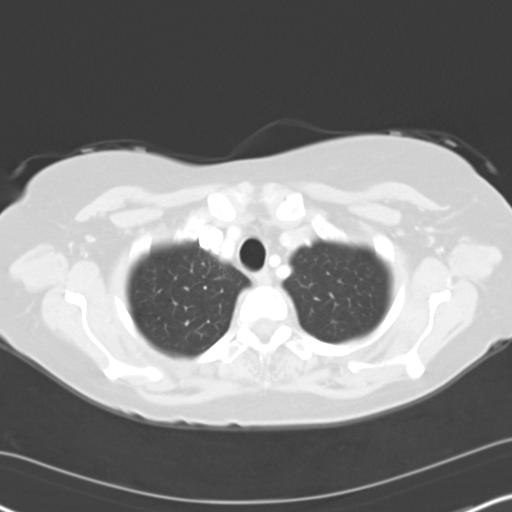
[im 53/56  lung]
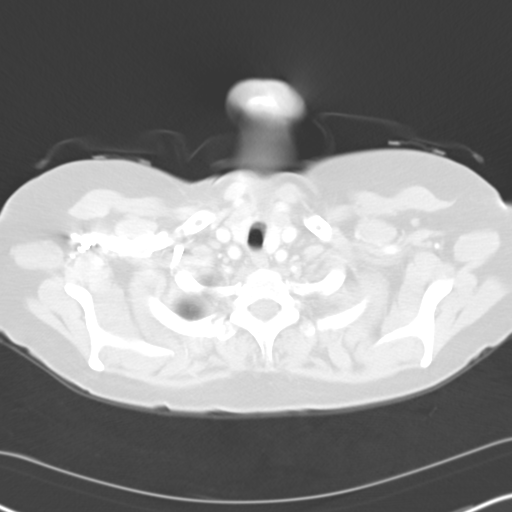

[15 of 31 positions shown; findings below may reference images not displayed]

PROCEDURE:     CT  - CT CHEST WITH CONTRAST  - March 25, 2011  [DATE]

RESULT:     Axial CT scanning was performed through the chest at 5 mm
intervals and slice thicknesses following intravenous ministration of 75 cc
of Isovue 300. The patient received 80 since the sedation protocol prior to
receiving IV contrast due to history of contrast allergy. Comparison made to
study 18 August, 2010.

There is new abnormal linear low density within the the spleen. The
appearance suggests that of a splenic laceration. There have been
abnormalities within the spleen in the past on a CT of 31 August, 2010 at
which time a possible splenic infarction was raised. Today's abnormality is
considerably smaller. I see no subcapsular hemorrhage.

Since the previous study the right basilar atelectasis versus infiltrate has
nearly totally resolved. The right pleural effusion is nearly totally
resolved as well. The cavitary lesion seen previously inferiorly in the
right upper lobe has become less conspicuous with resolution of much of the
parenchymal consolidation surrounding it. A 1 cm diameter cavity with a
Central soft tissue density nodule is still present. This is appreciated
best on image 141. The left lung exhibits no cavitary lesions or other acute
abnormality.

Cardiac chambers are normal in size. I see no pathologic sized mediastinal
or hilar or axillary lymph nodes. Within the upper abdomen the observed
portions of the liver and gallbladder and adrenal glands appear normal.
IMPRESSION: 1. There has been considerable improvement in the appearance of the inferior
aspect of the right upper lobe where a cavitary lesion is present.
Surrounding inflammatory changes have decreased markedly. The right pleural
effusion is nearly totally resolved as well.
2. There is abnormal linear lucency through the spleen without evidence of
subcapsular hemorrhage. This may reflect the sequelae of previous infarction
or laceration. Again, no evidence of acute bleeding is identified.
3. I see no mediastinal nor hilar lymphadenopathy.

## 2014-12-09 ENCOUNTER — Telehealth: Payer: Self-pay

## 2014-12-09 NOTE — Telephone Encounter (Signed)
Opened in error

## 2014-12-17 NOTE — H&P (Signed)
PATIENT NAME:  Kayla Willis, ROSELAND MR#:  [id number] DATE OF BIRTH:  06-26-1961  DATE OF ADMISSION:  04/22/2012  PRIMARY CARE PHYSICIAN: Dr. Marylu Lund Dear   ER PHYSICIAN: Dr. Clemens Catholic    ADMITTING PHYSICIAN: Dr. Tilda Franco    PRESENTING COMPLAINT: Shortness of breath.   HISTORY OF PRESENT ILLNESS: The patient is a 54 year old lady who has been admitted three times prior to today this year for respiratory failure. She now presents with shortness of breath. Per history from the patient's sister, who is her caregiver and power-of-attorney, this afternoon the patient woke up from sleep, sleeping on the recliner with some cough and soon after coughing her oxygen sat dropped to 56% when she checked it with a pulse oximeter at home. At this time with persistent cough she activated EMS. At the time EMS arrived and checked her oxygen sat it was 69. They put her on BiPAP and brought her to the Emergency Room. Records from the Emergency Room physician stated that en route to the hospital the patient actually stopped breathing and became unresponsive. However, they could not intubate her because she still had persistent reflux. Here in the Emergency Room with continued BiPAP symptoms improved and she was referred to the hospitalist. Work-up so far shows no obvious abnormalities with ABG showing pH of 7.3 and pCO2 of 58. She was referred to hospitalist for further evaluation.   REVIEW OF SYSTEMS: CONSTITUTIONAL: No fever. Has fatigue and weakness. No pain, weight loss or weight gain. EYES: No blurred vision, redness, or discharge. ENT: No tinnitus or epistaxis. Has occasional difficulty swallowing. RESPIRATORY: Positive for cough which is nonproductive. No wheezing. CARDIOVASCULAR: No chest pain, orthopnea, pedal edema, or palpitations. GI: No nausea, vomiting, diarrhea, abdominal pain, or change in bowel habits. GU: No dysuria, frequency, or incontinence. ENDOCRINE: No polyuria, polydipsia, heat or cold intolerance.  HEMATOLOGIC: No anemia, easy bruising. No swollen glands. SKIN: No rashes, change in hair or skin texture. MUSCULOSKELETAL: No joint pain, redness, or swelling. Has limited activity in bilateral lower extremities from foot drop and generalized muscle weakness. NEUROLOGIC: No numbness, vertigo, or seizures. PSYCH: No anxiety or depression.   PAST MEDICAL HISTORY:  1. History of COPD with recurrent exacerbations. She has had four admissions this year for COPD.  2. History of Hepatitis C. 3. Remote history of polysubstance abuse. 4. 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 5. History of peripheral neuropathy. 6. Prolonged ICU visit for CVA.  7. Anxiety/depression.  8. Gastroesophageal reflux disease.  9. Bilateral foot drop.  10. Hypertension.  11. Steroid-induced hyperglycemia.  12. History of cardiac arrest.  13. The patient is essentially wheelchair bound and bedbound since her CVA.   PAST SURGICAL HISTORY:  1. Status post tracheostomy December 2011.  2. History of PEG tube placement with reversal. 3. Hysterectomy.   SOCIAL HISTORY: Currently on disability. Lives at home with her sister living with her. Reformed alcohol, tobacco, and recreational drug user, quit in 2011 after having stroke. Previously worked as a Water engineer.   FAMILY HISTORY: Positive for coronary artery disease in the mother, father, and sister.   ALLERGIES: IV pyelogram dye, penicillin, prednisone, sulfa, pineapple, and tomatoes.   MEDICATIONS:  1. Aspirin 325 mg daily.  2. Percocet 5/325 1 tablet q.6 p.r.n.  3. Lexapro 10 mg daily.  4. Zofran 4 mg 1 to 2 tablets 3 times daily p.r.n.  5. Lipitor 10 mg at bedtime. 6. Abilify 5 mg daily.  7. Alprazolam  1 mg 3 times daily p.r.n.  8. Metoprolol 100 mg twice daily.  9. Albuterol inhaler p.r.n.  10. Spiriva 18 mcg daily.  11. Ventolin 2 puffs q.4 hours p.r.n.  12. Amlodipine 10 mg daily.  13. Nystatin 100,000  apply topically to skin 3 times daily. 14. Tizanidine 2 mg q.6 hourly p.r.n.   PHYSICAL EXAMINATION:   VITAL SIGNS: Temperature 98.2, pulse 73, respiratory rate 19, blood pressure 136/86 on arrival, now is 112/78, oxygen sat 95 on BiPAP on arrival, now is 96 on 2 liters nasal cannula.   GENERAL: Middle-aged lady looking older than stated age lying on the gurney awake, alert, oriented to time, place, and person in no overt distress, sister at bedside, supportive.   HEENT: Atraumatic, normocephalic. Pupils equal, reactive to light and accommodation. Extraocular movements intact. Mucous membranes pink, moist.   NECK: Supple. No JV distention.   CHEST: Shallow respiratory effort. Generalized rhonchi. No rales.   HEART: Regular rate and rhythm. No murmurs.   ABDOMEN: Full, moves with respiration, nontender. Bowel sounds normoactive. No organomegaly.   EXTREMITIES: No edema. No clubbing. No deformity except for bilateral foot drop lower extremities.   NEUROLOGICAL: Cranial nerves II through XII grossly intact. Sensory intact. Motor system mildly decreased with power 4/5 generalized in extremities.   PSYCH: Affect flat.   SKIN: No new rashes.   LABORATORY, DIAGNOSTIC, AND RADIOLOGICAL DATA: EKG showed normal sinus rhythm, rate of 68. No acute ST wave changes.   Chest x-ray showed no obvious infiltrates.   CBC white count 11 from 8.7 from two months ago. Hemoglobin 14, platelets 219. Chemistry unremarkable. Potassium 4.2, creatinine 0.8, BUN 14, glucose 130, calcium 8.9. AST 66 from 45 from two months ago. CK 56. Troponin negative. Urine drug screen is positive for opiates and benzodiazepines only which she takes. Urinalysis is negative.   IMPRESSION:  1. Hypoxic hypercarbic respiratory failure.  2. Chronic obstructive pulmonary disease exacerbation.  3. Gastroesophageal reflux disease.   4. History of Hepatitis C.  5. History of multiple CVAs.  6. Hypertension, stable.   7. Depression/anxiety, stable.  8. Coronary artery disease, stable.  PLAN:  1. Admit to general medical floor for respiratory support with oxygen, nebulizer, IV antibiotics, steroid therapy.  2. Resume the rest of her outpatient medications.  3. GI prophylaxis with Protonix.  4. DVT prophylaxis with Lovenox. 5. Serial cardiac enzymes.  6. Check TSH, magnesium.   CODE STATUS: FULL CODE.   TOTAL PATIENT CARE TIME: 50 minutes.   ____________________________ Floy SabinaMarcel I. Tilda FrancoAkuneme, MD mia:drc D: 04/22/2012 01:07:45 ET T: 04/22/2012 07:46:54 ET JOB#: [id number]  cc: Dail Lerew I. Tilda FrancoAkuneme, MD, <Dictator> Marylu LundJanet Dear, MD Margaret PyleMARCEL I Dinisha Cai MD ELECTRONICALLY SIGNED 04/22/2012 8:31

## 2014-12-17 NOTE — Discharge Summary (Signed)
PATIENT NAME:  Kayla Willis, Kayla Willis MR#:  [id number] DATE OF BIRTH:  Jan 03, 1961  DATE OF ADMISSION:  04/22/2012 DATE OF DISCHARGE:  04/24/2012  DISCHARGE DIAGNOSIS: Chronic obstructive pulmonary disease exacerbation secondary to pneumonia.  HISTORY AND PRESENTATION: This is a 54 year old female who has been admitted three times in the past for respiratory failure. She presented short of breath today. As per history given by her sister, who is also the caregiver for her and power of attorney, in the afternoon the patient woke up from sleep, she was sleeping in the recliner, with some cough and soon after that her oxygen level dropped to 56% on room air and then she checked the pulse oximetry at home.  She continued to have cough and the sister activated the EMS.  When EMS arrived oxygenation was 69%. She was put on BiPAP and during transportation to the emergency room she actually stopped breathing but EMS could not intubate her because of persistent reflux and they had to do bag and mask ventilation for a few minutes. On arrival to the hospital, the patient had spontaneous breathing and she was found having a pH of 7.3 and pCO2 was 58. She was started on BiPAP by the emergency room and she was admitted for acute respiratory failure secondary to COPD exacerbation.   HOSPITAL COURSE:  1. The patient was continued on Rocephin and Zithromax and IV steroids and BiPAP and oxygen for a few hours. She showed signs of improvement and she was able to wean off BiPAP. She was continued on IV antibiotics and IV steroids for one more day and the next day she felt much better so she was switched to oral steroids. She continued to improve gradually so her steroids were tapered off and she was discharged with an oral antibiotic. She was also discharged with inhalers.  2. Status post CVA with residual weakness and atrophy of the limbs. She was maintained on her home medications like aspirin and Lipitor and physical and rehab  evaluation was done during the hospital stay and on discharge physical therapy and occupational therapy was arranged by case management as recommended.  3. Hypertension remained controlled during the hospital stay.  4. Hyperglycemia was secondary to the high dose of steroid which was managed by sliding scale of insulin during the hospital stay.  5. For her depression and anxiety disorder we continued giving her Lexapro and Abilify and remained under control during the hospital stay.  6. She also had other complaints like gastroesophageal reflux disease for which we gave her Nexium and she has history of hepatitis C, but it remained stable during the hospital stay, so we did not intervene any for that.   DISCHARGE CONDITION: On discharge condition was good.   CODE STATUS: FULL CODE.   DRUG ALLERGIES: Her allergies are sulfa drugs, IV dye, and prednisone (actually the prednisone complaint was headache, but she received steroids and she was fine with that during the hospital stay.  MEDICATION RECONCILIATION/HOME MEDICATIONS ON DISCHARGE: 1. Lipitor 10 mg oral tablet once a day. 2. Aspirin 325 mg once a day. 3. Alprazolam 1 mg tablet three times daily as needed for anxiety. 4. Percocet 5/325 mg oral tablet every 6 hours as needed. 5. Tizanidine 2 mg oral tablet every 6 hours as needed. 6. Zofran 4 mg oral tablet three times daily as needed for nausea. 7. Amlodipine 10 mg oral tablet orally once a day.  8. Abilify 5 mg oral tablet once a day.  9. Spiriva 18  mcg inhalation capsule once a day. 10. Ventolin HFA every 4 hours as needed. 11. Albuterol 3 mL inhaled every 4 hours as needed. 12. Metoprolol 100 mg tablet twice a day. 13. Zithromax 500 mg capsule once a day for four more days. 14. Advair Diskus 100/50 mcg inhalation powder one puff inhaled twice a day. 15. Prednisone 10 mg oral tablet packet with instruction to start with 60 mg daily and taper by 10 mg every day until finished.  OTHER  INSTRUCTIONS ON DISCHARGE:  Home health aide and physical therapy and occupational therapy is arranged per recommendation of physical therapy by care manager.   HOME OXYGEN: None.  DIET: Low fat, low cholesterol. Diet consistent - mechanical soft.  ACTIVITY LIMITATIONS: As allowed by her condition status post CVA and followup with physical therapy.   TIME FOR FOLLOWUP: Followup with primary doctor within 1 week. She was advised to be followed by her primary medical doctor.  ____________________________ Hope Pigeon. Elisabeth Pigeon, MD vgv:slb D: 04/28/2012 16:39:53 ET T: 04/29/2012 14:13:07 ET JOB#: [id number]  cc: Hope Pigeon. Elisabeth Pigeon, MD, <Dictator> Altamese Dilling MD ELECTRONICALLY SIGNED 05/17/2012 14:36

## 2014-12-20 NOTE — H&P (Signed)
PATIENT NAME:  Kayla Willis Willis, Kayla Willis MR#:  [id number] DATE OF BIRTH:  1961/04/01  DATE OF ADMISSION:  09/22/2012  PRIMARY CARE PHYSICIAN:  Dr. Marylu LundJanet Willis.  CHIEF COMPLAINT:  Shortness of breath and cough.  HISTORY OF PRESENT ILLNESS: The history is obtained from the patient's sister, who [facility]. The patient currently is on BiPAP, unable to give much history, unable to give review of systems. The patient is a 54 year old Caucasian female with the past medical history of depression, anxiety, COPD, a history of a CVA who is bedbound and wheelchair bound due to significant neuropathy and foot drop, comes to the Emergency Room after she was found to have increasing shortness of breath, cough, congestion. She was found to be hypercarbic with CO2 of 60. She is currently on a BiPAP. Her sat is 100%. She is being admitted for COPD exacerbation. Her chest x-ray is negative for any pneumonia. She has empirically received some Levaquin. In the Emergency Room, the patient received IV Solu-Medrol. I did verify with the patient's sister who is [facility] Willis [facility] and manages her medication. The patient has some intolerance to p.o. prednisone. She is able to take IV Solu-Medrol.   PAST MEDICAL HISTORY:  1. COPD with recurrent exacerbations.  2. A history of hepatitis-C.  3. A remote history of polysubstance abuse.  4. Multiple CVAs. 5. Peripheral neuropathy.  6. Anxiety depression.  7. Bilateral foot drop, the patient is bedbound and wheelchair bound.  8. Steroid-induced hyperglycemia.  9. A history of cardiac arrest.  10. GERD.  PAST SURGICAL HISTORY: 1. Post tracheostomy in 2011.  2. PEG tube placement with reversal in the past.  3. Hysterectomy.   SOCIAL HISTORY: Lives with her sister at home. She used to be a recreational drug user and alcohol user. She quit in 2011 after having a stroke.   FAMILY HISTORY: Positive for CAD in father and mother.   ALLERGIES: IVP DYE, PENICILLIN, SULFA, PINEAPPLE AND TOMATOES. THE  PATIENT HAS A LISTED ALLERGY FOR PREDNISOLONE, WHICH IS ONLY ORAL PREDNISONE CAUSES HER MORE SHORTNESS OF BREATH PER HER SISTER. SHE CAN TOLERATE IV SOLU-MEDROL.   CURRENT MEDICATIONS:  1. Abilify 5 mg daily.  2. Acetaminophen/oxycodone 5/325, 1 tablet q.6 hours as needed for pain.  3. Albuterol/ipratropium 1 mL every 4 to 6 times daily as needed for shortness of breath and wheezing. 4. Alprazolam 1 mg tablet 0.5 to 1 tablet 3 times a day as needed.  5. Amlodipine 10 mg daily.  6. Aspirin 325 mg p.o. daily.  7. Atorvastatin 10 mg daily at bedtime.  8. Docusate  p.o. daily.  9. Escitalopram 20 mg daily.  10. Levaquin 500 mg daily, was started about a day or two ago.  11. Metoprolol 100 mg b.i.d.  12. Nystatin powder apply to affected area as needed.  13. Zofran 4 mg 1 to 2 tablets sublingual 3 times a day as needed.  14. Pulmicort Respules, one vial inhalation by nebulizer twice a day.  15. Spiriva 18 mcg inhalation daily.  16. Ventolin HFA 2 puffs every 4 hours as needed.   REVIEW OF SYSTEMS: Unobtainable. The patient is on BiPAP and somewhat lethargic.   PHYSICAL EXAMINATION:  GENERAL: The patient is obese, mild respiratory distress, is currently on BiPAP, sats are 100%, temperature 99.3, pulse is 70, blood pressure is 139/77. The patient currently is on BiPAP.  HEENT: Atraumatic, normocephalic. Pupils:  PERRLA, EOMI. Oral mucosa is dry.  NECK: Supple. No JVD. No carotid bruit.  RESPIRATORY:  The patient has currently on BiPAP. She is not in respiratory distress, or no use of accessory muscles. Some coarse breath sounds heard. No crackles heard.  CARDIOVASCULAR: Both the heart sounds are normal. Rate, rhythm regular. PMI not lateralized. Chest nontender.  EXTREMITIES: Good pedal pulses, good femoral pulses. No lower extremity edema.  NEUROLOGICAL:  The patient has chronic bilateral foot drop. She has weakness 3+/5 in both the lower extremities.  SKIN: Warm and dry.  PSYCHIATRIC: The  patient is somewhat lethargic, and currently is on BiPAP.  LABORATORY, DIAGNOSTIC, AND RADIOLOGICAL DATA: cardiac enzymes first set negative. CBC within normal limits except white count of 14.8. Comprehensive metabolic panel within normal limits. PH of 7.29, pCO2 of 60. Chest x-ray: No acute cardiopulmonary abnormality.   ASSESSMENT: A 54 year old patient with a history of chronic obstructive pulmonary disease, cerebral vascular accident and hypertension comes to the Emergency Room with:  1. Hypoxic hypercarbic respiratory failure, acute on chronic. The patient currently is on BiPAP. We will start the patient on IV Solu-Medrol around-the-clock, continue nebulizer treatment with albuterol and Pulmicort Respules. We will also continue the patient on her oral inhalers. Wean her off the BiPAP as tolerated. Continue Solu-Medrol IV around-the-clock. The patient is intolerant to oral steroids. The patient's family is requesting Pulmonary outpatient follow-up which can be done at discharge.  2. Anxiety and depression. We will continue the patient's Abilify and Xanax.  3. Hypertension. Continue metoprolol.  4. Leukocytosis with possible acute bronchitis. We will continue the patient on Levaquin, monitor white count and fever curve. Send sputum culture if the patient is able to produce phlegm.  5. A history of cerebrovascular accident. Continue aspirin. The patient is bedbound.  6. Deep vein thrombosis prophylaxis with heparin.  7. Further work-up over the patient clinical course.   The hospital admission plan was discussed with the patient's family members including patient's sister, Kayla Willis Willis, who is the healthcare power of attorney.   TIME SPENT: 50 minutes.  ____________________________ Wylie Hail Allena Katz, MD sap:jm D: 09/22/2012 15:26:55 ET T: 09/22/2012 17:16:02 ET JOB#: [id number]  cc: Sharilyn Geisinger A. Allena Katz, MD, <Dictator> Kayla Willis Lund Dear, MD Willow Ora MD ELECTRONICALLY SIGNED 09/22/2012 21:36

## 2014-12-20 NOTE — Consult Note (Signed)
PATIENT NAME:  Kayla Willis, Kayla Willis MR#:  [id number] DATE OF BIRTH:  Jan 17, 1961  DATE OF CONSULTATION:  09/24/2012  CONSULTING PHYSICIAN:  Pauletta BrownsYuriy Marquez Ceesay, MD  HISTORY OF PRESENT ILLNESS: The patient is a 54 year old Caucasian female with past medical history of depression, COPD, severe anxiety, chronically wheelchair-bound with history of neuropathy and foot drop, comes in with shortness of breath. She did have a chronic history of COPD and she was started on Levaquin and Solu-Medrol. Today she appears to be a little bit more lethargic with increased shortness of breath. She still appears to be anxious, and her tremor is still evident at times, but, again, it is distractible and not seen during other periods of time. The patient is status post MRI of the brain which showed multiple watershed distribution infarcts that I have seen on the FLAIR sequence.   NEUROLOGICAL EXAMINATION:  VITAL SIGNS: Temperature of 97.9, pulse 55, respirations 18, blood pressure 96/68. She is on 2 liters nasal cannula, and pulse oximetry is 92%.  CRANIAL NERVE EXAMINATION: Extraocular movements appear to be intact. Pupils appear to be reactive bilaterally. Facial sensation is intact. Facial motor is intact. Tongue is midline. Uvula elevates symmetrically.  MOTOR EXAMINATION: Decreased shoulder shrug. The patient appears to be 4-/5 bilateral upper extremities, the lower extremities 2 to 2-/5. The mild tremors are still evident as described above.   IMPRESSION: The patient is a 54 year old female with chronic obstructive pulmonary disease, hypertension, anxiety, depression, here for COPD exacerbation and was found to have tremors which come and go. She is status post MRI of the brain that showed multiple watershed infarcts anterior and posterior circulation.   PLAN: At this point, I would not again treat her tremors unless she is severely anxious with anxiolytics for imaging that shows infarcts. These infarcts are seen on the FLAIR,  which appeared to be more chronic.  They appear to be in a watershed distribution between the ACA, MCA and MCA, PCA distribution which makes me suspect that these are infarcts due to periods of hypotension and decreased perfusion to those areas of the brain. She is currently nonfocal on examination.  At this point, I would not work up those ischemic changes that I have seen on the MRI.   I believe they are incidental findings at this time, not acute, and do not contribute to course of care while currently in the hospital.   Thank you.  It was a pleasure seeing this patient   ____________________________ Pauletta BrownsYuriy Vaishali Baise, MD yz:cb D: 09/24/2012 13:30:35 ET T: 09/24/2012 16:32:01 ET JOB#: [id number]  cc: Pauletta BrownsYuriy Asyria Kolander, MD, <Dictator> Pauletta BrownsYURIY Apolonia Ellwood MD ELECTRONICALLY SIGNED 10/08/2012 18:10

## 2014-12-20 NOTE — Discharge Summary (Signed)
PATIENT NAME:  Kayla Willis, Kayla Willis MR#:  [id number] DATE OF BIRTH:  Nov 10, 1960  DATE OF ADMISSION:  09/22/2012 DATE OF DISCHARGE:  09/27/2012  DISCHARGE DIAGNOSES: 1. Hypoxic hypercarbic respiratory failure, acute on chronic, now close to baseline, improved with steroids and antibiotic.  2. Leukocytosis with possible acute bronchitis, improved with course of Levaquin.   SECONDARY DIAGNOSES: 1. Chronic obstructive pulmonary disease with recurrent exacerbation.  2. History of hepatitis C.  3. History of polysubstance abuse.  4. Multiple cerebrovascular accidents. 5. Peripheral neuropathy.  6. Anxiety and depression.  7. Bilateral foot drop.  8. Steroid-induced hyperglycemia.  9. History of cardiac arrest. 10. Gastroesophageal reflux disease.  CONSULTATIONS:  1. Pauletta Browns, MD - Neurology. 2. Speech therapy.   PROCEDURES/RADIOLOGY: Chest x-ray on the 24th of January showed no acute cardiopulmonary disease.   MRI of the brain with and without contrast on the 25th of January showed multifocal bilateral cortical infarcts, likely chronic in nature.  MAJOR LABORATORY PANEL: Blood cultures x 2 were negative, on the 24th of January.   HISTORY AND SHORT HOSPITAL COURSE: The patient is a 54 year old female with above-mentioned medical problems who was admitted for hypoxic, hypercarbic respiratory failure, acute on chronic. We started her on IV Solu-Medrol, Pulmicort, albuterol, Symbicort and Spiriva. She was also started on BiPAP as needed. She was found to have tremors for which neurology consultation was obtained with Dr. Loretha Brasil who recommended MRI of the brain, which was negative for any acute pathology. The tremor was thought to be due to severe anxiety.   The patient was evaluated by speech therapy and underwent modified barium swallow and based on that speech therapy recommendations were made for mechanic soft diet. The patient was slowly improving in terms of her respiratory status and  her tremor was also improved with benzodiazepine. She was close to her baseline, was feeling much better and was discharged home on the 29th of January. On the date of discharge her vital signs were as follows: Temperature 97.9, heart rate 61 per minute, respirations 16 per minute, blood pressure 114/73 mmHg. She was saturating 92% on room air.   PERTINENT PHYSICAL EXAMINATION ON THE DATE OF DISCHARGE:  CARDIOVASCULAR: S1, S2 normal. No murmur, rub or gallop.  LUNGS: Clear to auscultation bilaterally. No wheezing, rales, rhonchi or crepitation.  ABDOMEN: Soft, benign.  NEUROLOGIC: The patient had decreased shoulder shrug and had about 4 out of  5 strength in both upper extremities and 2 to 3 out of 5 strength in lower extremities, which was at her baseline. Her sensations were intact. She still had minimal tremors, but much improved. All other physical examination remained at baseline.   DISCHARGE MEDICATIONS: 1. Aspirin 325 mg p.o. daily.  2. Metoprolol 100 mg p.o. twice a day.  3. Docusate/senna 1 tablet p.o. daily.  4. Acetaminophen/oxycodone 1 tablet p.o. every 6 hours as needed.  5. Ventolin 2 puffs inhaled every 4 hours as needed.  6. Zofran 4 mg 1 to 2 tablets sublingual 3 times a day as needed.  7. Alprazolam 1 mg 1/2 to 1 tablet p.o. 3 times a day as needed.  8. Nystatin topical to affected area once a day as needed.  9. Spiriva once daily as needed.  10. Abilify 5 mg p.o. daily.  11. Pulmicort twice a day as needed.  12. DuoNebs 4 to 6 times a day as needed.  13. Escitalopram 20 mg p.o. daily.  14. Atorvastatin 10 mg p.o. at bedtime.  15. Prednisone 60 mg p.o.  daily taper by 10 mg daily until finished.   DISCHARGE DIET: Low sodium, low fat, low cholesterol, mechanical soft, thin liquids. Strict aspiration precaution. Avoid straws if able. Medication whole in puree. Feeding aid at meals with upright positioning. Moisten foods well. Eat/drink slowly with small bites/sips.    DISCHARGE ACTIVITY: As tolerated.   DISCHARGE INSTRUCTIONS AND FOLLOWUP: The patient was instructed to follow up with her primary care physician, Dr. Marylu LundJanet Dear, in 1 to 2 weeks. [facility].   She was set up to get physical therapy evaluation and management while at the facility.   TOTAL TIME SPENT: 55 minutes.  ____________________________ Ellamae SiaVipul S. Sherryll BurgerShah, MD vss:sb D: 09/29/2012 11:50:50 ET T: 09/29/2012 13:48:54 ET JOB#: [id number]  cc: Yahsir Wickens S. Sherryll BurgerShah, MD, <Dictator> Marylu LundJanet Dear, MD [facility] Ellamae SiaVIPUL S [facility] MD ELECTRONICALLY SIGNED 09/30/2012 11:20

## 2014-12-20 NOTE — Consult Note (Signed)
PATIENT NAME:  Kayla Willis, Journey MR#:  [id number] DATE OF BIRTH:  05/06/1961  DATE OF CONSULTATION:  09/23/2012  REFERRING PHYSICIAN:    CONSULTING PHYSICIAN:  Pauletta BrownsYuriy Daleon Willinger, MD  REASON FOR DICTATION: Tremors.   HISTORY OF PRESENT ILLNESS: This is a 54 year old Caucasian female with a past medical history of depression, COPD, severe anxiety, depression who is chronically wheelchair-bound due to significant neuropathy and foot drop, who comes to Emergency Room for increasing shortness of breath. The patient is a chronic COPDer and a smoker. In the ED she was started on Levaquin and Solu-Medrol. Since admission, her shortness of breath has improved as per family members are at the bedside. When questioned about the tremor, the patient states she has been having chronic tremor in her upper extremities, more so with change positions or sudden movement. This has been going on for a long time and it not acute in nature.   PAST MEDICAL HISTORY:  Significant for COPD, hepatitis-C, anxiety, depression, peripheral neuropathy, bilateral foot drop, steroid-induced hyperglycemia, a history of cardiac arrest, GERD. The patient's is on multiple medications at home.   SOCIAL HISTORY: Lives with her sister, used to be a drug use and alcohol user, a history of smoking.  REVIEW OF SYSTEMS: Mild shortness of breath, mild cough, denies any chest pain. No abdominal discomfort, no urinary incontinence or diarrhea.   PHYSICAL EXAMINATION: The patient's temperature is 99.1, pulse 67, respirations 20, blood pressure is 111/78, saturation is 94%.  NEUROLOGICAL EXAMINATION: The patient is awake, alert and oriented x 3. Cranial nerve examination, extraocular movements are intact. Pupils reactive bilaterally. Extraocular movements again are intact. Facial sensation intact. Facial motor intact. Tongue is midline. Uvula elevates symmetrically. On motor examination a slight decrease in shoulder shrug. The patient appears to be 4/5  bilateral upper extremity, and 2/5 bilateral lower extremity. When asked to elevate the patient extremities, they appeared to be tremulous with varying amplitude. When asked to hold her hands up and distracted and talk to the patient about a different topic that tremor does subside. Sensation intact bilaterally, finger-to-nose coordination intact. No signs of dysmetria. Gait could not be assessed.   IMPRESSION: This is a 54 year old female with chronic obstructive pulmonary disease, hypertension, anxiety, depression, comes in with chronic obstructive pulmonary disease exacerbation, started on Solu-Medrol and antibiotics. Reason for neurologic consultation is tremor. This tremor appears to be exacerbated by sudden movement or stress and anxiety. When asked for the patient to hold her hands up, the tremor appears to be a varying aptitude and is very distractible.   PLAN: At this point, I do not think a further treatment is necessary because I believe this tremor is consistent with anxiety. At the same time, she might have essential tremor, but less likely because as it is distractible, and there is no pass pointing on finger-to-nose testing. To treat essential trauma, we would have to start the patient on beta blockers which is contraindicated with COPD, start on primidone which is sedative in nature and would not be beneficial for the patient with a history of COPD. Again, at this point, I do not think the patient needs any further treatment for her tremor and it is most likely anxiety related. Thank you, it was a pleasure seeing this patient.   ____________________________ Pauletta BrownsYuriy Melchizedek Espinola, MD yz:jm D: 09/23/2012 13:28:27 ET T: 09/23/2012 14:28:14 ET JOB#: [id number]  cc: Pauletta BrownsYuriy Jaymir Struble, MD, <Dictator> Pauletta BrownsYURIY Jahnessa Vanduyn MD ELECTRONICALLY SIGNED 09/24/2012 13:42

## 2014-12-22 NOTE — H&P (Signed)
PATIENT NAME:  Kayla Willis, [name]:  [id number] DATE OF BIRTH:  03-Sep-1960  DATE OF ADMISSION:  12/10/2011  REFERRING PHYSICIAN: Governor Rooksebecca Lord, MD  PRIMARY CARE PHYSICIAN: Marylu LundJanet Dear, MD  PRESENTING COMPLAINT: Altered mental status, cough, and shortness of breath.   HISTORY OF PRESENT ILLNESS: Kayla Willis is a 54 year old woman with history of multiple CVAs thought to be embolic recently discharged on 12/07/2011 with residual right-sided weakness, history of hepatitis C, and chronic obstructive pulmonary disease presenting from home with her sister who is power of attorney. The patient does not provide much history. She apparently has not been as communicative since her multiple strokes, but is still able to answer questions appropriately when addressed. Her sister reports since she was discharged on 12/07/2011 her respiratory status has declined with worsening cough to the point where she is almost passing out and developed shortness of breath and yesterday was confused. She believes she has had some subjective fever. No nausea or vomiting. Reports that when she drinks from a straw she has coughing spells. The patient herself denies any chest pain or shortness of breath currently. Reports that her cough is actually improved with placement of oxygen. No actual loss of consciousness.  PAST MEDICAL HISTORY:  1. Admitted 04/04 to 12/07/2011 for management of cerebrovascular accident thought to be embolic with right upper extremity weakness.  2. Peripheral with right foot drop, wheelchair dependent.  3. Hypertension.  4. Depression and anxiety.  5. Chronic obstructive pulmonary disease.  6. Gastroesophageal reflux disease.  7. Hepatitis C.  8. Distant polysubstance abuse with alcohol, marijuana, and tobacco.   ALLERGIES: IV dye, penicillin, prednisolone, sulfa drugs, pineapple, tomato.   MEDICATIONS:  1. Aspirin 325 mg daily.  2. Alprazolam 1 mg 1/2 to 1 tablet three times daily as needed.   3. Percocet 5/325 mg one tablet every six hours as needed.  4. Tizanidine 2 mg every six hours as needed.  5. Ondansetron 4 mg 1 to 2 tablets three times daily as needed.  6. Amlodipine 10 mg daily.  7. Metoprolol 50 mg twice a day. 8. Abilify 5 mg daily.  9. Escitalopram 10 mg daily.  10. Spiriva daily.  11. Ventolin 2 puffs every four hours as needed.  12. Lipitor 10 mg daily.  13. Albuterol nebulizer every four hours as needed.   FAMILY HISTORY: Mother died at the age of 56 from heart disease, her father in his 3360s from heart disease and carotid stenosis, and her sister died at age 56 from heart disease.   SOCIAL HISTORY: Quit tobacco in November 2011 along with alcohol and marijuana. Currently at home getting outpatient physical therapy as well as home health through [facility].  PAST SURGICAL HISTORY:  1. Total abdominal hysterectomy.  2. Tracheotomy and PEG tube placement in November 2011 admission, which is now reversed.   Admitted between 07/08/2010 to 09/15/2010 with complicated hospital course in the setting of acute respiratory failure and chronic obstructive pulmonary disease exacerbation with aspiration pneumonitis receiving trach and PEG tube with complications of Candida,sepsis, Klebsiella pneumonia.   REVIEW OF SYSTEMS: CONSTITUTIONAL: Subjective fevers. No nausea or vomiting. EYES: No changes in vision. ENT: She has coughing spells with drinking through a straw. RESPIRATORY: As per history of present illness. CARDIOVASCULAR: No chest pain. Reports presyncope with cough but no loss of consciousness. GI: No nausea or vomiting. Endorses constipation. No hematemesis or melena. GU: No dysuria or hematuria. ENDOCRINE: No polyuria or polydipsia. HEMATOLOGIC: No easy bleeding. SKIN: No ulcers. MUSCULOSKELETAL:  No joint swelling. NEUROLOGIC: Reports headache and recent admission for stroke. PSYCH: Denies any suicidal ideation.   PHYSICAL EXAMINATION:   VITAL SIGNS: Temperature  98.1, pulse 86, respiratory rate 21, blood pressure 147/83, and saturating  96% on oxygen supplementation of 2 liters.   GENERAL: Lying in bed, weak appearing, in no apparent distress.   HEENT: Normocephalic, atraumatic. Pupils equal, symmetric. Nasal cannula in place. Moist mucous membranes.   NECK: Soft and supple. No adenopathy. She has slightly elevated JVP of approximately 7 cm.   CARDIOVASCULAR: Non-tachy. No murmurs, rubs, or gallops.   LUNGS: Coarse breath sounds and diffuse wheezing and poor airway movement. No use of accessory muscles or increased respiratory effort.   ABDOMEN: Soft. Positive bowel sounds. No mass appreciated.   EXTREMITIES: Trace edema bilaterally. She has deformity of her right foot with her foot drop.   NEUROLOGIC: The patient has decreased strength of bilateral upper extremities, 3/5.   PSYCH: She responds to questions appropriately.   PERTINENT LABS AND STUDIES: EKG with normal sinus rate of 100. No ST elevation or depression.  Troponin less than 0.02. WBC 12.5, hemoglobin 15.9, hematocrit 50.2, platelet 202, and MCV 86. Glucose 93, BUN 18, creatinine 0.99, sodium 143, potassium 3.3, chloride 107, carbon dioxide 28, calcium 8.6, AST 49, total bilirubin 0.3, alkaline phosphatase 83, ALT 46.   Chest x-ray with fluid fissure on the right and congestion with probable left-sided pneumonia. Official chest x-ray reading is pending.   ASSESSMENT AND PLAN: Kayla Willis is a 54 year old woman with history of CVAs, presumed embolic, peripheral neuropathy and foot drop, hypertension, depression, anxiety, chronic obstructive pulmonary disease, hepatitis C, and distant polysubstance abuse presenting with altered mental status, cough, and shortness of breath.  1. Altered mental status likely in the setting of her respiratory process with acute respiratory failure and chronic obstructive pulmonary disease exacerbation. Chest x-ray with some fluid and probable left-sided  pneumonia. We will start on Solu-Medrol, SVNs and oxygen. Probably aspiration and given recent hospitalization we will go ahead and cover with vancomycin and imipenem. We will get a speech evaluation, keep n.p.o. for now, resume her Spiriva, administer a dose of Lasix given the coarse breath sounds as well as evidence of fluids on chest x-ray.  2. Embolic CVAs. Resume Lipitor and aspirin.  3. Constipation. Last bowel movement was two weeks ago. She received lactulose from her sister. We will go ahead and implement on a bowel regimen.  4. Hypokalemia. Send magnesium level, replace as needed.  5. Hypertension. Restart amlodipine and metoprolol  6. Prophylaxis with Lovenox and Protonix and aspirin.   TIME SPENT: Approximately 50 minutes was spent on patient care.  ____________________________ Reuel Derby, MD ap:slb D: 12/10/2011 02:35:36 ET T: 12/10/2011 07:47:20 ET JOB#: [id number]  cc: Pearlean Brownie Tinlee Navarrette, MD, <Dictator> Marylu Lund Dear, MD Reuel Derby MD ELECTRONICALLY SIGNED 12/27/2011 0:07

## 2014-12-22 NOTE — H&P (Signed)
PATIENT NAME:  Kayla Willis, Kayla Willis MR#:  [id number] DATE OF BIRTH:  November 13, 1960  DATE OF ADMISSION:  12/03/2011  PRIMARY CARE PHYSICIAN: Dr. Marylu Lund Dear   CHIEF COMPLAINT: Headache, slurred speech and right-sided weakness.   HISTORY OF PRESENT ILLNESS: Kayla Willis is a 54 year old Caucasian female with past medical history of asthma and systemic hypertension. Last time she had prolonged admission in 2011 in November, discharged in January 2012 after having acute respiratory failure and asthma exacerbation. The patient reported last two weeks she has cough and sputum production. She was given antibiotic, Levaquin, along with prednisone, however, she developed headache and she thought it was from the prednisone which was discontinued. However, Kayla headache persisted over the last two weeks. Additionally over the last couple of days she noticed she is dropping things from Kayla right hand due to weakness. Along with that in the last 24 hours she was noticed to have slurred speech. Patient was brought here for evaluation and CAT scan done here revealed subacute ischemic findings in both cerebral hemispheres and also left cerebellar area. Patient was admitted for further evaluation and management. The patient also reports nausea and she vomited bile earlier today.   REVIEW OF SYSTEMS: CONSTITUTIONAL: Denies any fever. No chills. No night sweats. No fatigue. EYES: No blurring of vision. No double vision. ENT: No hearing impairment. No sore throat. No dysphagia. CARDIOVASCULAR: No chest pain. No shortness of breath other than wheezing and cough. Patient reports that Kayla cough is chronic for more than a year but over the last couple of weeks she has more cough. No syncope. RESPIRATORY: No shortness of breath but reports cough. No hemoptysis. No chest pain. GASTROINTESTINAL: No abdominal pain. Reports one episode of vomiting biliary material. No hematemesis, no melena or hematochezia. GENITOURINARY: No dysuria or frequency of  urination. MUSCULOSKELETAL: No joint pain or swelling. No muscular pain or swelling other than chronic right leg pain due to Kayla peripheral neuropathy. INTEGUMENTARY: No skin rash. No ulcers. NEUROLOGIC: She reports weakness in Kayla right arm. No seizure activity. She reported headache. No syncope. No seizure activity. PSYCHIATRY: History of depression and anxiety. ENDOCRINE: No polyuria or polydipsia. No cold or heat intolerance.   PAST MEDICAL HISTORY:  1. History of asthma.  2. History of systemic hypertension.  3. History of chronic hepatitis C.  4. Remote history of alcoholism and cannabinoid abuse and past history of tobacco abuse, but she quit two years ago.  5. History of peripheral neuropathy affecting the right leg and rendering the patient immobile, using wheelchair for ambulation.   FAMILY HISTORY: Kayla Willis died at the age of 39 from complications of coronary artery disease. Kayla Willis in his 75s suffered from coronary disease and carotid stenosis. Kayla Willis at age of 16 she had coronary artery disease.   SOCIAL HABITS: History of smoking. She quit a few years ago. Remote history of alcoholism and cannabinoid abuse but none currently.   PAST SURGICAL HISTORY: History of total hysterectomy.   ADMISSION MEDICATIONS:  1. Oxycodone with Tylenol 5/325 q.6 hours p.r.n. for Kayla right leg pain.  2. Xanax or alprazolam 0.5 mg 3 times a day.  3. Lexapro 10 mg a day. 4. Abilify 5 mg a day.  5. Tizanidine 2 mg every six hours p.r.n. for muscular spasm.  6. Metoprolol 50 mg 2 tablets twice a day; that is 100 mg twice a day.   ALLERGIES: Penicillin.   PHYSICAL EXAMINATION:  VITAL SIGNS: Blood pressure 189/86, respiratory rate 20, pulse 56,  temperature 98.1, oxygen saturation 100%.   GENERAL APPEARANCE: Middle-aged female laying in bed in no acute distress.   HEAD AND NECK EXAMINATION: No pallor. No icterus. No cyanosis.   ENT: Hearing was normal. Nasal mucosa, lips, tongue were  normal.   EYES: Normal eyelids and conjunctivae. Pupils about 5 mm, equal and reactive to light.   NECK: Supple. Trachea at midline. No thyromegaly. No cervical lymphadenopathy. There is old scar tissue from previous tracheostomy. No cervical masses.   HEART: Distant heart sounds. Faint S1 and S2 due to noisy pulmonary sounds. No murmur. No gallop. No carotid bruits.   RESPIRATORY: Normal breathing pattern without use of accessory muscles. No rales but she has rhonchi and wheezing.   ABDOMEN: Soft without tenderness. No hepatosplenomegaly. No masses. No hernias.   SKIN: No ulcers. No subcutaneous nodules.   MUSCULOSKELETAL: No joint swelling. No clubbing.   NEUROLOGIC: Cranial nerves II through XII are intact. Patient had slurred speech. Right arm weakness 3/5 compared to the left 5/5. She has right leg weakness but the strength in this leg is better than the arm, it is about 4/5. There is contracture of the right foot from Kayla chronic peripheral neuropathy. She has hypersensitivity of skin of the right leg to simple touch.   PSYCHIATRIC: Patient is alert, oriented to place, people and time although she thought that this is month of March but she was accurate for the year and the date being the 5th.   LABORATORY, DIAGNOSTIC, AND RADIOLOGICAL DATA: CAT scan of the head revealed possibly subacute or late ischemic changes in the posterior high right frontal region. Similar finding but less prominent area in the high left parietal region in the left cerebellar hemisphere. No intracranial hemorrhage or mass. Serum glucose 87, BUN 11, creatinine 0.8, sodium 144, potassium 3.9, total protein 6.8, albumin 3.3, AST 55, ALT 29. CBC showed white count 12,000, hemoglobin 15, hematocrit 48, platelet count 216. Urinalysis was unremarkable.   ASSESSMENT:  1. Middle-aged female presented with slurred speech and right arm weakness over the last 24 hours. Kayla CAT scan showed subacute findings of ischemic  stroke.  2. Chronic cough associated with asthma with recent exacerbation over the last two weeks.  3. Severe systemic hypertension, uncontrolled.  4. History of chronic hepatitis C.  5. 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   PLAN: The patient will be admitted to the medical floor on telemetry monitoring. Neuro check q.2 hours. Start enteric-coated aspirin 325 mg a day. Neurology consultation. I will continue treatment for Kayla peripheral neuropathy with the oxycodone and muscle relaxant. For Kayla pulmonary findings and asthma exacerbation with bronchitis. I will place Kayla on Zithromax intravenously along with a small dose of IV Solu-Medrol 40 mg twice a day and DuoNebs. Blood pressure control. I will continue metoprolol 100 mg twice a day. I will add amlodipine 5 mg a day. For Kayla gastrointestinal symptoms of nausea and vomiting I placed Kayla on Zofran intravenously p.r.n. along with PPI using Protonix 40 mg p.o. daily. Continue Kayla antidepressant medication using Lexapro and Abilify along with alprazolam for Kayla anxiety. I will obtain chest x-ray since it was not done at the Emergency Department. I spoke with Kayla daughter who has the power of attorney. Kayla name is Gwynneth Aliment. The patient does not have a living well.   TIME SPENT ON THIS PATIENT: Took more than one hour.   ____________________________ Carney Corners. Rudene Re, MD amd:cms D: 12/03/2011 00:52:52 ET T: 12/03/2011 06:56:35  ET JOB#: [id number]  cc: Carney CornersAmir M. Rudene Rearwish, MD, <Dictator> Marylu LundJanet Dear, MD Zollie ScaleAMIR M Rona Tomson MD ELECTRONICALLY SIGNED 12/03/2011 22:17

## 2014-12-22 NOTE — Discharge Summary (Signed)
PATIENT NAME:  Kayla Willis, Kayla Willis MR#:  [id number] DATE OF BIRTH:  12-25-1960  DATE OF ADMISSION:  12/02/2011 DATE OF DISCHARGE:  12/07/2011  NOTE: She was not discharged on 12/05/2011 because she was still having some significant right upper extremity weakness and planned other procedures such as TEE. Please refer to Dr. Arlys JohnVaickute's Discharge Summary on 12/04/2011.   DISCHARGE DIAGNOSES:  1. 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  2. Right upper extremity weakness.  3. History of peripheral neuropathy with right foot drop.  4. Hypertension. 5. Depression. 6. Anxiety.  7. Chronic obstructive pulmonary disease exacerbation.  8. Acute bronchitis.  9. Gastroesophageal reflux disease.   DISCHARGE CONDITION: Stable.   DISCHARGE MEDICATIONS: The patient is to resume her outpatient medications which are: 1. Metoprolol tartrate 100 mg p.o. twice daily.  2. Norvasc 10 mg p.o. daily.  3. Zanaflex 2 mg every 6 hours as needed.  4. Abilify 5 mg p.o. daily.  5. Lexapro 10 mg p.o. daily.  6. Xanax 0.5 mg p.o. every 8 hours as needed.  7. Protonix 40 mg p.o. daily.  8. Aspirin 325 mg p.o. daily.  9. Lipitor 10 mg p.o. at bedtime.   ADDITIONAL MEDICATIONS:  1. Albuterol nebulizer 2.5 mg in 3 mL solution every 4 hours as needed.  2. Zithromax 240 mg p.o. daily for 4 more days.  3. Lipitor 10 mg p.o. at bedtime.  4. Tylox 5/325 mg, 1 tablet every 4 hours as needed, not more than 2 grams daily of acetaminophen.   HOME OXYGEN: None.   DIET: 2 grams salt, mechanical soft.   PHYSICAL ACTIVITY LIMITATIONS: As tolerated.   REFERRALS: Home Health, Physical Therapy, Occupational Therapy, as well as RN and Speech Therapy.    FOLLOWUP: Follow-up appointment with Dr. Marylu LundJanet Dear in two days after discharge.   HOSPITAL COURSE: Please refer to Dr. Arlys JohnVaickute's Discharge Summary on 12/04/2011. As mentioned above, the patient was not discharged on the 6th because she apparently was not  able to go to the skilled nursing facility because of insurance issues; however, she was so weak she was still not able to return back home and requested a Physical Therapy evaluation. She was seen by Dr. Regino SchultzeWang on 12/05/2011, who continued to follow up with the patient while she was in the hospital. She was also followed up by Dr. Sherryll BurgerShah, neurologist. Both of them felt that the patient's stroke in multifocal areas in bilateral cerebral hemispheres based on appearance of the stroke on MRI appeared to be likely embolic phenomenon. The patient was recommended to have MRA as well as MRV done due to concerns of possible venous sinus  thrombosis, however, that was not found on MRA/MRV. 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 Regino SchultzeWang, was unlikely to be related to the patient's clinical picture. He recommended to continue aspirin therapy for antiplatelet therapy as the patient was not taking regularly aspirin prior to hospitalization. Even if the patient reported having some fluttering in her heart, she did not have any atrial flutter,  or atrial fibrillation, or any kind of arrhythmia while she was hospitalized. No evidence of atrial fibrillation on telemetry was noted. Dr. Regino SchultzeWang recommended to monitor the patient while she was hospitalized on telemetry; however, the patient may benefit from outpatient Holter monitor, then she would need to be considered for anticoagulation with Coumadin therapy. The patient's LDL was checked and was found to be  82, and the patient was started on atorvastatin because of recommendations for patients with stroke to have LDL below 100, preferably below 70.   The patient, unfortunately, did not have a hemoglobin A1c checked while she was in the hospital; however, the patient's fasting blood glucose levels were ranging from 87 on the 12/02/2011. It is, however, recommended to get a formal glucose  tolerance test or hemoglobin A1c as outpatient to ensure that the patient does not have prediabetes. Dr. Regino Schultze also recommended to get a TEE done even despite the patient's known normal transthoracic echocardiogram. The patient's erythrocyte sedimentation rate  as well as CPR was checked, and those were both within normal limits, rendering that very low suspicion for vasculitic process contributing to the patient's stroke. The  patient had leukocytosis; for this reason, blood cultures as well as TEE were performed for the patient prior to discharge from the hospital due to concerns of possible septic emboli. TEE was performed on 12/06/2011 and read by Dr. Lady Gary. Report is left ventricular systolic function is normal, borderline left atrial enlargement. No thrombus is detected in left atrial appendage. Right atrial size is normal. Injection of contrast documented no intraarterial shunt. The intraarterial septum is intact with no evidence of atrial septal defect. There is borderline mitral valve prolapse. There is trace mitral regurgitation. The tricuspid valve was not well visualized but grossly normal. There was trace tricuspid regurgitation. The aortic valve was found to be normal in structure and function. The aortic root was normal in size. After this procedure, however, the patient developed significant bronchial spasm with worsening shortness of breath as well as hypoxia and required her to stay for the next 24 hours. The patient had her chest x-ray repeated on 12/06/2011; however, that showed no acute changes. The patient was continued on inhalation therapy as well as antibiotic therapy and improved as time progressed. She was weaned off oxygen again, and on the day of discharge the patient's oxygenation is good, 97% on room air.   DISCHARGE VITAL SIGNS:  Her vital signs on the day of discharge, 12/07/2011 are temperature 97.7, pulse 66, respiration rate 22, blood pressure 131/85, saturation 97% on room air at  rest.   DISPOSITION: The patient is being discharged to home with her sister. She is to be followed by Speech Therapy, a physical therapist, as well as an occupational therapist as outpatient. She is to follow up with her primary care physician for further recommendations.   DISCHARGE LABS: On the day of discharge her creatinine was 0.72 on 12/06/2011. White blood cell count was normal at 10.4. Blood cultures taken on 12/04/2011 showed no growth.   TIME SPENT: 40 more minutes. ____________________________ Katharina Caper, MD rv:cbb D: 12/07/2011 20:09:11 ET T: 12/08/2011 10:05:00 ET JOB#: [id number] cc: Katharina Caper, MD, <Dictator> Marylu Lund Dear, MD Katharina Caper MD ELECTRONICALLY SIGNED 12/23/2011 19:46

## 2014-12-22 NOTE — Discharge Summary (Signed)
PATIENT NAME:  Kayla Willis, Kayla Willis MR#:  [id number] DATE OF BIRTH:  [birth year]  DATE OF ADMISSION:  12/03/2011 DATE OF DISCHARGE:  12/04/2011  ADMITTING DIAGNOSIS: Slurred speech as well as right arm weakness concerning for stroke.   DISCHARGE DIAGNOSES:  1. 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  2. Right upper extremity weakness due to stroke.  3. History of peripheral neuropathy.  4. Hypertension. 5. Depression and anxiety. 6. Chronic obstructive pulmonary disease exacerbation. 7. Acute bronchitis.  8. Gastroesophageal reflux disease.   DISCHARGE CONDITION: Stable.   DISCHARGE MEDICATIONS: The patient is to resume the following medications. 1. Albuterol nebulizers once puff every 4 hours p.r.n.  2. Zithromax 250 mg p.o. daily for four more days.  3. Prednisone 60 mg p.o. once, on 12/05/2011, then taper x 10 mg daily until stopped.  4. Lipitor 10 mg p.o. at bedtime.  5. Aspirin 325 mg p.o. daily.  6. Protonix 40 mg p.o. daily.  7. Tylox 5/325 mg one tablet every 4 hours p.r.n. No more than 2 mg of acetaminophen a day.  8. Metoprolol 100 mg p.o. twice daily.  9. Xanax 0.5 mg p.o. every eight hours as needed.  10. Lexapro 10 mg p.o. daily.  11. Abilify 5 mg p.o. daily.  12. Zanaflex 2 mg p.o. every six hours as needed.  13. Norvasc 10 mg p.o. daily.   HOME OXYGEN: None.   DIET: 2 grams salt, mechanical soft.  ACTIVITY LIMITATIONS: As tolerated.   REFERRALS: The patient is referred to home health physical therapy, occupational therapy, as well as RN upon discharge.  DISCHARGE FOLLOWUP: Followup with Dr. Marylu Lund Dear in two days after discharge.   CONSULTANTS:  1. Social Work. 2. Particia Lather, MD. 3. Cristopher Peru, MD.   RADIOLOGIC STUDIES: Chest portable single view on 12/02/2011 showed density along the region of minor fissure without further evidence of acute cardiopulmonary disease.   Chest PA and lateral on 12/03/2011 showed discoid atelectasis in  the region of the lingula.   CT of head without contrast on 12/02/2011 showed findings suggestive of nonacute, possibly subacute or late subacute ischemic change in the posterior high right frontal region with a similar less prominent area in the high left parietal region and in the left cerebellar hemisphere. No intracranial hemorrhage, mass, or mass effect was noted.   MRI of the brain without contrast 12/04/2011 showed findings consistent with innumerable acute/subacute infarcts.    MRA of the brain without contrast 12/04/2011 showed visualized anterior, middle, and posterior cerebral arteries are patent. 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   HISTORY OF PRESENT ILLNESS:  The patient is a 54 year old Caucasian female with past medical history significant for history of asthma, systemic hypertension, chronic hepatitis C, history of alcoholism, and cannabinoid abuse as well as tobacco abuse in the past, also peripheral neuropathy effecting the right lower extremity who presented to the hospital with complaints of headaches and slurred speech as well as right-sided weakness. Please refer to Dr. Riley Nearing admission note on 12/03/2011. Apparently the patient was admitted to our hospital in November of 2011 and discharged in January 2012 after acute respiratory failure as well as asthma exacerbation. She did quite well, however, over the past few weeks she started noticing some cough as well as sputum production. She was given some antibiotic, Levaquin, along with prednisone. However, she developed a headache and thought it was from prednisone. Her headache however persisted for two weeks  and over the last couple of days she noticed that she was having problems carrying things in the right hand due to weakness of the right hand. She also noticed slurred speech. She was brought to the hospital and had a CT scan done which revealed subacute or ischemic  findings in both cerebral hemispheres as well as cerebellar area and was admitted for further evaluation and management.   On arrival to the emergency room, the patient's blood pressure was 189/86, respiration rate was 20, pulse was 56, temperature was 98.1, and oxygen saturation was 100% on room air. Physical examination showed slurred speech as well as right upper extremity weakness 3 out of 5  comparatively to 5 out of 5 on the left. She also had right leg weakness that was 4 out of 5. The patient had contracture in the right foot due to peripheral neuropathy. She also had hypersensitivity of the skin in the right lower extremity to simple touch.   LABS/STUDIES: On 12/02/2011 BMP was within normal limits. LFTs showed elevation of AST to 55 and albumin level of 3.3, otherwise unremarkable study. The patient's white blood cell count was slightly elevated at 12.8, hemoglobin 15.9, and platelet count 216. Absolute neutrophil count was slightly elevated at 7.2. Erythrocyte sedimentation rate was 1.   Urinalysis was unremarkable.   The patient's EKG showed sinus brady at 52 beats per minute, prolonged QTc of 479 m/sec, and no acute ST-T changes were noted.   HOSPITAL COURSE: The patient was admitted to the hospital. Because of concerns of stroke, she underwent a few radiologic studies including MRI of her brain without contrast, initially on 12/03/2011, which showed innumerable acute to subacute infarcts throughout cerebral cortex bilaterally and about the cerebellum. These findings were most consistent with multiple acute infarcts. However, several of the lesions were slightly rounded with surrounding edema and possibility of metastatic disease or infectious etiology was also not excluded. No hydrocephalus was noted. Paranasal sinuses were clear.   (Dictation stopped) ____________________________ Katharina Caperima Kemoni Quesenberry, MD rv:slb D: 12/04/2011 16:38:00 ET     T: 12/06/2011 13:39:03 ET       JOB#: [id number] cc: Katharina Caperima  Amilyah Nack, MD, <Dictator> Marylu LundJanet Dear, MD Katharina CaperIMA Kamal Jurgens MD ELECTRONICALLY SIGNED 12/23/2011 19:39

## 2014-12-22 NOTE — Discharge Summary (Signed)
PATIENT NAME:  Kayla Willis, Kayla Willis MR#:  [id number] DATE OF BIRTH:  12-02-1960  DATE OF ADMISSION:  12/10/2011 DATE OF DISCHARGE:  12/16/2011  Discharge is pending Level II Passar availability.   CHIEF COMPLAINT: Shortness of breath.   DISCHARGE DIAGNOSES:  1. Acute respiratory failure with COPD exacerbation and pneumonia in lingular area.  2. Recent embolic cerebrovascular accident.  3. Hypertension.  4. Anxiety/depression.  5. Peripheral neuropathy.  6. Steroid-induced hyperglycemia.   CODE STATUS: FULL CODE.   DISCHARGE MEDICATIONS:  1. Tylenol 650 mg p.o. q.4 p.r.n.  2. MiraLAX 17 grams orally daily p.r.n. for constipation.  3. Aspirin 325 p.o. daily.  4. Tizanidine 2 mg q.6 p.r.n.  5. Norvasc 10 mg daily.  6. Abilify 5 mg daily.  7. Lexapro 10 mg daily.  8. Metoprolol 50 mg b.i.d.  9. Lipitor 10 mg daily.  10. Spiriva 1 capsule inhalation daily.  11. Xanax 1 mg t.i.d. p.r.n. for agitation.  12. Symbicort 160/4.5 2 puffs b.i.d.  13. Tussionex 5 mL b.i.d. p.r.n.  14. Docusate 100 mg b.i.d. p.r.n. for constipation.  15. Sliding scale insulin.  16. Percocet 5/325 1 p.o. q.4 p.r.n.  17. Prednisone 60 mg p.o. daily, taper by 10 mg daily then stop.  18. Physical therapy.  19. Incentive spirometer q.i.d.  20. Oxygen 2 liters per minute as needed to keep sats greater than 92%.. 21. Mechanical soft 2 gram sodium diet. 22. DuoNebs q.4 p.r.n.   BRIEF SUMMARY OF HOSPITAL COURSE: Kayla Willis is a 54 year old Caucasian female with multiple medical problems including history of new onset recent embolic CVA, presumed embolic, peripheral neuropathy, right foot drop, hypertension, depression, and COPD who came to the Emergency Room with altered mental status, cough, and shortness of breath. She was admitted with:  1. Acute hypoxic respiratory failure secondary to COPD exacerbation. She was also noted to have pneumonia in the lingular area on chest x-ray. She was started on IV Solu-Medrol,  given antibiotics with IV meropenem, and initially started on vancomycin, however, that was discontinued. The patient completed a course of meropenem for 7 days. She was started on DuoNebs, Spiriva, and Symbicort, did better. Repeat chest x-ray was negative for pneumonia which showed resolution of pneumonia. The patient has history of previous intubation and previous tracheostomy. She was weaned off her oxygen. Sats remained anywhere from 89 to 93% on room air. Will use p.r.n. oxygen as needed.  2. Recent embolic CVA. Continue Lipitor and aspirin. Physical therapy and occupational therapy was continued while in the hospital.  3. Hypertension. Amlodipine was increased along with continued metoprolol.  4. Anxiety/depression. She is on Abilify and Escitalopram which have been continued.  5. Peripheral neuropathy with right foot drop.  6. Hyperglycemia secondary to steroids. Steroids have been changed to p.o. taper. Hopefully sugars will come down. Will continue sliding scale insulin as needed while the patient is on the steroids. The patient is medically improved, stable, and ready for discharge once Passar number is obtained.   Basic metabolic panel within normal limits except for sugars of 329. Chest x-ray repeated on April 16th no acute disease of the chest. CBC within normal limits. Magnesium is 2.2. Cardiac enzymes x3 negative. Blood cultures negative in five days. EKG is normal sinus rhythm.   Above was discussed with the patient's sister and the patient who voiced understanding.       TIME SPENT: 40 minutes.  ____________________________ Wylie Hail Allena Katz, MD sap:drc D: 12/16/2011 14:02:22 ET T: 12/17/2011 08:53:02 ET JOB#: [id number]  cc: Cashae Weich A. Allena KatzPatel, MD, <Dictator> Marylu LundJanet Dear, MD Willow OraSONA A Latiana Tomei MD ELECTRONICALLY SIGNED 12/20/2011 13:20

## 2014-12-22 NOTE — Discharge Summary (Signed)
PATIENT NAME:  Kayla Willis, Finnlee MR#:  [id number] DATE OF BIRTH:  1961-06-13  DATE OF ADMISSION:  01/25/2012 DATE OF DISCHARGE:  01/31/2012  DISCHARGE DIAGNOSES: 1. Metabolic encephalopathy due to urinary tract infection. 2. History of multiple cerebrovascular accidents. 3. Chronic obstructive pulmonary disease. 4. Hypertension.  5. Anxiety/depression.  DISCHARGE MEDICATIONS:  1. Lipitor 10 mg daily. 2. Aspirin 325 mg daily. 3. Xanax 1 mg half tablet three times a day as needed.  4. Percocet 5/325 every six hours as needed.  5. Tizanidine 2 mg every six hours as needed. 6. Zofran 4 mg 1 to 2 tablets three times a day. 7. Amlodipine 10 mg p.o. daily. 8. Metoprolol 50 mg p.o. b.i.d.  9. Abilify 5 mg p.o. daily. 10. Lexapro 10 mg once a day. 11. Spiriva 18 mcg inhalation daily. 12. Ventolin 2 puffs every four hours.   ADDITIONAL MEDICATIONS:  1. Metformin 500 mg p.o. b.i.d.  2. Prednisone 30 mg daily for two days, 20 mg p.o. daily for two days, 10 mg p.o. daily for two days and stop.   The patient is with new diagnosis of diabetes mellitus. She will start diabetic medications. She will have home health assistance to help her check sugars. She is advised to follow-up with her primary doctor in a month to check on her diabetes.   PRIMARY CARE DOCTOR: Marylu LundJanet Dear, MD   CONSULTATION: Neurology consult with Dr. Derry Skilllarke    HOSPITAL COURSE:  1. Confusion. 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 The patient was admitted because of confusion. Look at the history and physical for details. When she came in she had trouble breathing with coughing, wheezing, and confusion. The patient's chest x-ray showed discoid atelectasis with comparison to previous no change. No  pneumonia. Heart is normal. She was started  on steroids, nebulizers, along with antibiotics. She was on oxygen but her oxygen sats were more than 97. She did have some improvement with cough and trouble breathing and she continued her home medications and her oxygen sats were 97% on 2 liters.  2. ESBL urinary tract infection. The patient had 25,000 colonies of ESBL. The patient did receive Ertapenem from the 30th to the 3rd for five days. She was afebrile and her white count was within normal limits. The family was told to follow-up with primary doctor to repeat urine cultures.  3. The patient has hypertension and restarted the metoprolol. 4. Diabetes mellitus, type II, new diagnosis. The patient's hemoglobin A1c was elevated at 7.2. She is also on steroids. She was started on metformin and advised to follow-up with primary doctor regarding her sugars.  5. History of previous CVAs and confusion. She was seen by Dr. Kemper Durielarke, neurologist, on admission. CT of the head showed probable acute strokes but MRI of the brain did not show any new strokes. She had old strokes. She had LDL of 50, triglycerides 48. She is advised to continue her home medications, aspirin, statin, and continue physical therapy.   TOTAL TIME SPENT ON DISCHARGE PREPARATION: More than 30 minutes.   ____________________________ Katha HammingSnehalatha Gaelyn Tukes, MD sk:drc D: 01/31/2012 22:45:43 ET T: 02/01/2012 11:27:37 ET JOB#: [id number]  cc: Katha HammingSnehalatha Gustav Knueppel, MD, <Dictator> Marylu LundJanet Dear, MD Katha HammingSNEHALATHA Chaise Passarella MD ELECTRONICALLY SIGNED 02/07/2012 14:08

## 2014-12-22 NOTE — Consult Note (Signed)
PATIENT NAME:  Kayla Willis, Kayla Willis MR#:  [id number] DATE OF BIRTH:  1961/08/10  DATE OF CONSULTATION:  01/26/2012  REFERRING PHYSICIAN:  Dr. Dava NajjarPanwar CONSULTING PHYSICIAN:  Rose PhiPeter R. Kemper Durielarke, MD  HISTORY: Kayla Willis is a 54 year old right-handed white patient of Dr. 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 She was last admitted to [facility] 04/12 to 12/16/2011 for acute respiratory failure associated with exacerbation of COPD and lingula area pneumonia. She is now admitted 01/25/2012 for problems including confusion, blurred vision, shortness of breath, and hypoxia. She is referred for evaluation of recurrent stroke. History comes from the patient, her health power of attorney, sister, Kayla Willis ([phone]), her hospital chart, and her hospital records.   The patient was brought to the Emergency Room at 2:30 p.m. on 01/25/2012 by EMTs summoned by the patient's sister secondary to concern with regard to suspected pneumonia or bronchitis not responding to outpatient antibiotic treatment. It is reported that the patient was at her recent baseline bedbound functional status until the morning of Friday, 01/21/2012 when on awakening, she was significantly short of breath, coughing, wheezing, confused, and reported change in vision, and  that everything looked purple. Her sister found oxygen saturation low and contacted the office of Dr. Dear who prescribed antibiotic. The patient's confusion improved and her vision was no longer purple that afternoon, but she had continued bouts of shortness of breath and coughing and severe wheezing. White blood count on admission was 10,900. CT scan of the brain on admission was suggestive of new bilateral inferior frontal lobe areas of infarction compared with CT scan and MRI scan of admission of 12/03/2011. The patient has had symptomatic improvement in the hospital.   PHYSICAL EXAMINATION: The patient is a moderately overweight white woman who is examined lying semisupine in no apparent distress, notable for plantar flexion contracture of the right foot. Blood pressure is 105/65 with heart rate 84. She was afebrile. She was normocephalic without evidence of trauma with the exception that she had scar from prior tracheostomy. Her neck was supple. She was awake and conversant, mildly lethargic with slightly slurred speech and with normal expression. She was a fair historian and was easily distracted for history and examination, often inappropriate and laughing. On cranial nerve examination, face appearance was symmetric at rest and with conversation, eye movements were normal, and visual fields were full to finger count for each eye; visual acuity was not tested. Motor power testing was suboptimal because of suboptimal cooperation. Strength of the extremities was rated overall greater than or equal to 4-/5 for the right upper extremity, greater than or equal to 3+/5 for the left upper extremity, greater than or equal to 3/5 for both lower extremities with the exception of 0/5 power for right foot dorsiflexion. She had no tremor. There was mild dystaxia of left finger-to-nose movements.   IMPRESSION:  1. Bedbound status secondary to motor deficits of areas of stroke 12/02/2011 including area of infarction of  the left cerebellum and multiple bilateral  watershed area infarctions.  2. Altered mental status at present suggestive of a combination of lack of sleep and side effect of initiation of Solu-Medrol.  3. With regard to question of recurrent stroke, she had brain MRI scan last evening. This was reviewed today with Dr. Charlies Silvers of radiology. The scan shows no areas of altered signal intensity on diffusion weighted imaging to suggest acute or subacute new infarction. Her MRI scan shows areas of altered signal intensity on FLAIR, T1, and T2 imaging which were all consistent with areas seen on her prior MRI scan of 12/03/2011.    RECOMMENDATIONS:  1. I agree with your present work-up and treatment in the hospital, and having her continue on daily aspirin.  2. Given her body habitus and report from the patient's daughter that the patient snores, it is recommended that consideration be given to outpatient overnight sleep study for question of significant sleep apnea. Her sister, health care power of attorney, Ms. Willis, indicated that she will plan to discuss this with Dr. Dear.   I appreciate being asked to see this pleasant and interesting lady.   ____________________________ Rose Phi. Kemper Durie, MD prc:ap D: 01/26/2012 17:20:31 ET            T: 01/27/2012 08:45:05 ET JOB#: [id number] cc: Rose Phi. Kemper Durie, MD, <Dictator> Gaspar Garbe MD ELECTRONICALLY SIGNED 02/22/2012 19:58

## 2014-12-22 NOTE — Discharge Summary (Signed)
PATIENT NAME:  Kayla Willis, Kayla Willis MR#:  [id number] DATE OF BIRTH:  06/09/1961  DATE OF ADMISSION:  12/03/2011 DATE OF DISCHARGE:  12/04/2011  ADDENDUM  HOSPITAL COURSE: On arrival to the hospital, the patient's vital signs were remarkable for  blood pressure, systolic blood pressure was found to be 189/86. Her temperature was however within normal limits as well as her pulse and respiration rate as well as oxygen saturation. Physical examination showed right-sided weakness. Chest x-ray was unremarkable, however, a CT of her head revealed subacute or late subacute acute ischemic changes in posterior high right frontal region as well as similar less prominent area in the high left parietal region and left cerebellar hemisphere. Because of concern for stroke, the patient was admitted to the hospital. She was started on aspirin therapy. She underwent MRI of her brain, on 12/03/2011. It showed innumerable regions of similar intensity throughout cerebral cortex bilaterally and about cerebellum. These findings, according to the radiologist, were most consistent with multiple acute infarcts, however several of the lesions were slightly rounded with surrounding edema and possibility of metastatic disease or infectious etiology could not be completely excluded. The patient was evaluated by neurologist, Dr. Sherryll BurgerShah, on 12/03/2011. Dr. Sherryll BurgerShah felt that because of headache erythrocyte sedimentation rate, CRP, and blood cultures were ordered and he recommended to continue aspirin therapy. He discussed not starting anticoagulation with the patient's at this time. The patient had erythrocyte sedimentation rate checked, which was found to be within normal limits at 1. The patient did not unfortunately have blood cultures taken on 12/03/2011, however, those blood cultures were ordered today, 12/04/2011. C-reactive protein was taken on 12/04/2011, however those results are not available at the time of dictation. The patient was evaluated  by the neurologist on call, Dr. Modesto CharonWong, on 12/04/2011. Dr. Modesto CharonWong felt that the patient's stroke was multifocal areas of stroke in bilateral cerebral hemispheres based on the appearance of MRI, most likely due to embolic phenomenon. However, given the patient's current complaints of headache, he could not completely rule out that stroke could have been related to venous sinus thrombosis. That is why he recommended to check MRA and MRV of brain, which was performed on 12/04/2011. He also recommended to hold off any other anticoagulation unless MRV confirms presence of venous sinus thrombosis. He recommended to continue aspirin for antiplatelet therapy as the patient was not taking aspirin regularly prior to hospitalization. The patient's lipid panel was checked and LDL was found to be 82. The patient was started on Lipitor. Neurologist, Dr. Modesto CharonWong, agreed on Lipitor therapy. He however recommended for the patient to have liver enzymes checked because of known history of liver problems. The neurologist also recommended to check the patient's Hemoglobin A1c. However, the patient was not hypoglycemic on the day of admission. Hemoglobin A1c will be checked on 12/04/2011. Dr. Modesto CharonWong again recommended to check blood cultures to rule out possibility of septic emboli given the patient's previous history of infections and therapy for infections with IV medications. Carotid Doppler's were performed and those were unremarkable. Transthoracic echocardiogram was also performed and showed normal left ventricular ejection fraction. It also showed trace mitral as well as tricuspid regurgitation, but no aortic regurgitation was noted and no hemodynamically significant valvular aortic stenosis was present on this study. As the patient's erythrocyte sedimentation rate was within normal limits, Dr. Modesto CharonWong had very low suspicion for vasculitic process contributing to the patient's stroke. He recommended to continue physical therapy and  occupational therapy as well as speech therapy to  evaluate for rehabilitation potential. As the patient has known history of wheelchair dependence and has help at home daily with caretaker who takes care of her, it was felt that the patient would benefit from discharge to home with home health physical therapy. The patient will have her labs rechecked today, especially CBC, which showed mild elevation of white blood cell count, and if that looks like it is within normal limits, then the patient will likely be discharged home with home health. However, if the patient's white blood cell count continues to rise, the patient will be started on antibiotic therapy after the patient's blood cultures are taken and we will not plan to discharge her at this point.   However, on the day of discharge, 12/04/2011, the patient's temperature is ranging from 97.9 to 99.1, pulse 55 to 69, respiration rate 17 to 18, blood pressure 137/82, and oxygen saturation 94 to 95% on room air at rest.  TIME SPENT: 40 minutes.   ____________________________ Katharina Caper, MD rv:slb D: 12/04/2011 16:53:00 ET T: 12/06/2011 14:31:31 ET JOB#: [id number]  cc: Katharina Caper, MD, <Dictator> Marylu Lund Dear, MD Denim Start MD ELECTRONICALLY SIGNED 12/21/2011 04:54

## 2014-12-22 NOTE — Consult Note (Signed)
PATIENT NAME:  Kayla, Willis MR#:  [id number] DATE OF BIRTH:  Feb 04, 1961  DATE OF CONSULTATION:  12/03/2011  REFERRING PHYSICIAN:  Abel Presto, MD CONSULTING PHYSICIAN:  Hemang K. Manuella Ghazi, MD PRIMARY CARE PHYSICIAN: Marcie Bal Dear, MD  REASON FOR CONSULTATION: Headache, slurred speech, and right hand weakness.   HISTORY OF PRESENT ILLNESS: Ms Kayla Willis is a 54 year old right-handed Caucasian female who had a prolonged hospital stay in November 2011 to January 2012 due to respiratory issues and fungemia, etc. During that time she had developed right foot drop and "neuropathy" which is very painful, and she had some foot changes.   Around two weeks ago in late March 2013 the patient was having more cough and cold and received a prednisone dose taper. She started having some headache around late March 2013. On December 02, 2011, around 11:00 patient was noted to have some speech difficulty and weakness of her right hand. The patient was "dehydrated" before that, and she was having some increased blood pressure before that.   She was also having some nausea and vomiting, and she was brought to the ER. Found to have some slurred speech and the right hand weakness. She received CT scan of the head and then MRI which showed multifocal ischemic lesions.   The patient denied any palpitation sensation or focal pain or swelling, etc.   REVIEW OF SYSTEMS: Positive for headache, nausea, vomiting, right hand weakness and slurred speech, chronic right leg pain, difficulty breathing. The rest of the review of system was asked and was found to be negative.   PAST MEDICAL HISTORY: Significant for systemic hypertension, chronic hepatitis C, remote history of alcoholism and cannabinoid abuse. Past history of tobacco abuse. Quit around two years ago. The patient has a history of "peripheral neuropathy" affecting the right leg.   FAMILY HISTORY: Significant that the mother died at the age of 75 from the complication of  coronary artery disease. Father is in 30s, suffered from coronary artery disease and carotid stenosis. Sister at 81 had coronary artery disease.   SOCIAL HISTORY: Significant that she had a history of smoking. She quit a couple of years ago. She had a remote history of alcoholism and cannabis abuse.   MEDICATIONS: I reviewed her current medication list.   PAST SURGICAL HISTORY: Significant for total hysterectomy and tracheostomy.   ALLERGIES: She is allergic to penicillin.   PHYSICAL EXAMINATION:  VITAL SIGNS: Her temperature was 97.4, pulse 57, pulse oximetry 92, respiratory rate 16, blood pressure 156/88.   GENERAL: She is an elderly-looking, thin Caucasian female lying in the bed surrounded by family members.   She has a tracheostomy scar in her neck and she coughs periodically, has a hissing sounds.     She has right foot plantar flexion and almost arching of her sole.   LUNGS: Her lungs were difficult to examine due to her excessive external sounds, but she might also have some crackles.   HEART: S1 and S2 heart sounds. Her carotid exam did not reveal any bruit.   NEUROLOGIC: She was alert. She was oriented. She followed two-step commands verbally but she had some difficulty with long attention span.   Her concentration also seemed to be a little bit decreased but her memory seemed to be okay.   On her motor exam she has a profound weakness of the right hand as well as she has some apraxia of the left hand.   Her strength of the bilateral upper extremity seems to be okay.  She has a profound weakness of the dorsiflexion of the right foot.   She has a significant allodynia of the right foot.   Reflexes were symmetric except on the right foot.   REVIEW OF RADIOLOGICAL DATA: On her MRI of the brain she has multiple infarcts likely of different ages with left cerebellum 2 infarcts and right frontal and left frontal lobe infarcts.  It seems to be multiple vascular territories  well patchy infarcts.     Her carotid ultrasounds were unremarkable. Her lower extremity deep vein thrombosis ultrasound was also unremarkable.   ASSESSMENT AND PLAN: Multiple infarcts in multiple territories, has been concerning for embolic stroke.   Her workup so far has been negative with ultrasound of her carotid vessels. There is no mural thrombus on transthoracic echocardiogram.  Appreciate Dr. Edward Jolly help in obtaining lower extremity ultrasound which did not show any deep vein thrombosis either.   We should check ESR to look for is there any vasculitis due to her previous history of fungemia and other infections.  On presentation she has some leukocytosis so I also ordered three blood cultures to look for if she has any septicemia causing multiple infarcts.   I had discussed with Dr. Verdell Carmine about starting her on anticoagulation with Coumadin but after discussing with the patient and family we decided to continue only aspirin.   I also explained to them that other than atrial fibrillation and critical carotid stenosis, there is no evidence that anticoagulation with Coumadin helps in patients even with an embolic looking cryptogenic stroke.   I also informed the patient and family that typically we do not give IV heparin in patients with stroke due to the concern for hemorrhagic conversion.    Patient can take subcutaneous low-molecular weight heparin for deep vein thrombosis prophylaxis.   The patient is on statin. I talked to them about avoiding sudden drop in her blood pressure to prevent expansion of the ischemic penumbra.   If she has not received it, she should be considered for a flu vaccine.      The patient should receive aggressive PT, OT and Speech Therapy.   I will follow this patient with you in the hospital.      ____________________________ Hemang K. Manuella Ghazi, MD hks:vtd D: 12/03/2011 18:57:15 ET T: 12/04/2011 09:18:20 ET JOB#: [id number]  cc: Hemang K. Manuella Ghazi, MD,  <Dictator> Marcie Bal Dear, MD Royetta Crochet [facility] MD ELECTRONICALLY SIGNED 12/04/2011 13:38

## 2014-12-22 NOTE — H&P (Signed)
PATIENT NAME:  Kayla Willis, Kayla Willis MR#:  [id number] DATE OF BIRTH:  30-Jan-1961  DATE OF ADMISSION:  01/25/2012  ER REFERRING PHYSICIAN: Dorothea Glassman, MD    PRIMARY CARE PHYSICIAN: Marylu Lund Dear, MD   CHIEF COMPLAINT: Confusion, blurred vision, shortness of breath, hypoxia.   HISTORY OF PRESENT ILLNESS: The patient is not very communicative, and the majority of the information is obtained from the patient's sisters at bedside.   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 The patient has had a prolonged hospitalization due to chronic obstructive pulmonary disease exacerbation which resulted in prolonged mechanical ventilation, intubation and subsequent tracheostomy. The tracheostomy has now been removed. The patient lives at home with her Power of [employer] and sister, [name], who is her primary caregiver. The patient was last admitted to [facility] twice in April 2013 for acute respiratory failure due to chronic obstructive pulmonary disease, pneumonia, and recurrent embolic cerebrovascular accident. The patient gets physical therapy at home. As per the patient's sister, about four days ago the patient had recurrent transient ischemic attack symptoms with confusion, blurry vision, and her physical therapist recommended that she be re-evaluated. However, the patient at that time did not want to come to the hospital. She has also been having cough, shortness of breath, and when her sister checked her pulse oximetry at home it was running low. The patient's sister informed the patient's primary care physician, who called in an antibiotic for her. The patient has been taking the antibiotic for the past five days with no significant improvement.   PAST MEDICAL HISTORY:  1. The patient has  had a prolonged hospitalization including mechanical intubation, tracheostomy, with subsequent removal of tracheostomy, recurrent cerebrovascular accident thought to be embolic, however, no source of thrombus could be detected. The patient had an extensive work-up during her hospitalization in April, including TEE which showed normal LV systolic function, borderline LA enlargement. There was no evidence of any ASD, VSD or thrombus. Carotid ultrasound at that time also showed no acute abnormalities. MRI of the brain showed multiple prior strokes. MRA showed normal anterior, middle and posterior cerebral arteries. 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  2. Right footdrop.  3. Wheelchair-dependent. 4. Hypertension. 5. Depression. 6. Anxiety. 7. Chronic obstructive pulmonary disease. 8. Gastroesophageal reflux disease. 9. Hepatitis C. 10. History of polysubstance abuse in the past with alcohol, marijuana and tobacco.  11. Steroid-induced hyperglycemia.  12. Admitted from 07/08/2010 to 09/15/2010 for acute respiratory failure due to chronic obstructive pulmonary disease exacerbation and pneumonitis, complicated by candida, sepsis, and Klebsiella pneumonia.  ALLERGIES: Intravenous pyelogram dye, penicillin, prednisolone, sulfa, pineapple, tomatoes.  MEDICATIONS:  1. Abilify 5 mg daily.  2. Albuterol SVN every 4 hours p.r.n.  3. Xanax 1 mg 0.5 to 1 t.i.d. as needed.  4. Amlodipine 10 mg daily.  5. Aspirin 325 mg daily.  6. Lexapro 10 mg daily.  7. Lipitor 10 mg daily.  8. Lopressor 50 mg b.i.d.  9. Percocet 5/325, 1 tablet every 6 hours.  10. Spiriva 18 mcg inhaled daily.  11. Tizanidine 2 mg every 6 hours.  12. Ventolin HFA 2 puffs every 4 hours as needed.  13. Zofran 4 mg, 1 to 2 tablets t.i.d. as needed.   FAMILY HISTORY: Mother died at age 36 from heart disease.  Father died in his 5660s from heart disease and carotid stenosis. Sister died at the age of 56 from  heart disease.   SOCIAL HISTORY: The patient quit smoking in November 2011, along with marijuana and alcohol. She is currently living with her sister, Rosey Batheresa, who is her [employer] Power of Attorney.  PAST SURGICAL HISTORY:  1. Total abdominal hysterectomy.  2. Tracheostomy and PEG placement in November 2011, which has now been removed.   REVIEW OF SYSTEMS: CONSTITUTIONAL: The patient is not very communicative.  Denies any fever or fatigue. EYES: Denies any pain. No redness. Reports intermittent blurred vision. ENT: Denies any tinnitus, ear pain. RESPIRATORY: Both cough, wheezing and dyspnea. CARDIOVASCULAR: Denies any chest pain or palpitations. GI: Denies any nausea, vomiting, or diarrhea. GU: Denies any dysuria or hematuria. ENDOCRINE: Denies any polyuria or nocturia. HEME/LYMPH: Denies any anemia, easy bruisability. INTEGUMENTARY: Denies any acne, rash. MUSCULOSKELETAL: Denies any swelling or gout. NEUROLOGICAL: The patient is wheelchair bound, has foot drop and has generalized debility. PSYCHIATRIC: Has anxiety and depression.   PHYSICAL EXAMINATION:  VITAL SIGNS: Temperature 98.4, heart rate 68, respiratory rate 18, blood pressure 96/73, pulse oximetry 92%.   GENERAL: The patient is a chronically ill-appearing female lying comfortably in bed, not in acute distress.   HEENT: Head: Atraumatic, normocephalic. EYES: There is some pallor. No icterus or cyanosis. Pupils are equal, round  and reactive to light and accommodation. Extraocular movements intact. ENT: Dry mucous membranes. No oropharyngeal erythema or thrush.   NECK: Supple. No masses. No JVD. No thyromegaly or lymphadenopathy.   CHEST WALL: No tenderness to palpation. Not using accessory muscles of respiration. No intercostal muscle retractions.   LUNGS: Bilateral wheezing, rhonchi. no crepts    CARDIOVASCULAR: S1, S2 regular. No murmur, rubs, or gallops.   ABDOMEN: Soft, nontender, nondistended. No guarding, no rigidity. No  organomegaly. Normal bowel sounds.   SKIN: No rashes or lesions.   PERIPHERIES: There is some pedal edema. There is deformity of her right foot with foot drop, 1+ pedal pulses.   NEUROLOGICAL: The patient is lethargic but arousable. She is oriented to self and place. Motor strength is three out of five in her upper extremities.   PSYCHIATRIC: Normal mood and affect.   LABORATORY, DIAGNOSTIC AND RADIOLOGICAL DATA:  CT of the head shows evolution of encephalomalacia from multiple infarcts seen on prior MRI and CT. There are new areas in the inferior bilateral frontal lobes concerning for acute infarcts.  Urinalysis shows evidence of infection.  Abdomen: pH 7.38, pCO2 44, pO2 59.  Chest x-ray shows a transverse linear density in the right mid lung compatible with fluid in the minor fissure or with discoid atelectasis. The findings are unchanged as compared to prior examination of last month. No pneumonia is seen. Heart size remains normal. White count 10.9, hemoglobin 14.3, hematocrit 44.6, platelets 149.  Complete metabolic panel is essentially normal. AST 47.  Troponin is less than 0.02.  TSH 4.17, D-Dimer 0.44.   ASSESSMENT/PLAN: A 54 year old female with past medical history of recurrent cerebrovascular accident, chronic obstructive pulmonary disease, hypertension, anxiety, depression, presents with TIA symptoms of confusion, blurred vision and respiratory distress.   1. Possible recurrent cerebrovascular accident: The patient was last hospitalized twice in April, at which time she had extensive work-up for possible recurrent embolic cerebrovascular accident. No source of embolism/thrombus was identified. She was evaluated by a neurologist at that time. She had a TEE, carotid ultrasound and MRA, all of which were essentially normal. There was no  evidence of any ASD, VSD or intracardiac thrombus. The only abnormality seen on the MRA was a small 2 mm aneurysm in the anterior communicating  artery. We will continue the patient's aspirin and Lipitor. The patient's family is requesting a repeat Neurology consultation to get a better idea of the patient's prognosis. We will obtain a PT, OT, and Speech Therapy consultation.  2. Urinary tract infection: the patient's urinalysis shows evidence of infection. We will send a urine culture and start empiric antibiotics.  3. Acute respiratory failure: Possibly due to chronic obstructive pulmonary disease exacerbation. Chest x-ray shows no evidence of pneumonia. We will obtain blood cultures. The patient is allergic to prednisolone but has tolerated. IV steroids in the past. We will start her on Solu-Medrol, Symbicort, DuoNebs and empiric antibiotics, Levaquin.  4. History of hypertension: The patient's blood pressure is borderline low. We will restart her blood pressure medications with holding parameters.  5. History of anxiety, depression: We will continue Abilify and Xanax.   I have reviewed all medical records, discussed with the ED physician, discussed with the patient's family the plan of care and management.   TIME SPENT: 75 minutes.   ____________________________ Darrick Meigs, MD sp:cbb D: 01/25/2012 17:13:54 ET T: 01/26/2012 08:34:25 ET JOB#: [id number]  cc: Darrick Meigs, MD, <Dictator> Marylu Lund Dear, MD Darrick Meigs MD ELECTRONICALLY SIGNED 01/26/2012 15:01
